# Patient Record
Sex: Male | Born: 1965 | Race: White | Hispanic: No | Marital: Single | Smoking: Former smoker
Health system: Southern US, Community
[De-identification: ages and names within clinical notes are randomized; demographics above are authoritative.]

## PROBLEM LIST (undated history)

## (undated) DIAGNOSIS — Z930 Tracheostomy status: Secondary | ICD-10-CM

## (undated) DIAGNOSIS — J449 Chronic obstructive pulmonary disease, unspecified: Secondary | ICD-10-CM

## (undated) HISTORY — PX: BRONCHOSCOPY: SUR163

## (undated) HISTORY — PX: CARDIOTHORACIC PROCEDURE: SHX1298

---

## 2005-01-23 ENCOUNTER — Emergency Department: Payer: Self-pay | Admitting: Unknown Physician Specialty

## 2006-12-22 ENCOUNTER — Emergency Department: Payer: Self-pay | Admitting: Emergency Medicine

## 2007-08-07 ENCOUNTER — Emergency Department: Payer: Self-pay | Admitting: Emergency Medicine

## 2008-01-14 ENCOUNTER — Emergency Department: Payer: Self-pay | Admitting: Emergency Medicine

## 2008-06-27 ENCOUNTER — Emergency Department: Payer: Self-pay | Admitting: Emergency Medicine

## 2009-06-11 ENCOUNTER — Emergency Department: Payer: Self-pay | Admitting: Emergency Medicine

## 2009-10-06 ENCOUNTER — Emergency Department: Payer: Self-pay | Admitting: Emergency Medicine

## 2014-03-31 ENCOUNTER — Emergency Department: Payer: Self-pay | Admitting: Emergency Medicine

## 2014-03-31 LAB — URINALYSIS, COMPLETE
BILIRUBIN, UR: NEGATIVE
Bacteria: NONE SEEN
GLUCOSE, UR: NEGATIVE mg/dL (ref 0–75)
Ketone: NEGATIVE
NITRITE: NEGATIVE
PH: 5 (ref 4.5–8.0)
Protein: NEGATIVE
Specific Gravity: 1.02 (ref 1.003–1.030)
Squamous Epithelial: NONE SEEN
WBC UR: 507 /HPF (ref 0–5)

## 2014-04-04 LAB — URINE CULTURE

## 2014-04-09 ENCOUNTER — Emergency Department: Payer: Self-pay | Admitting: Emergency Medicine

## 2015-04-21 ENCOUNTER — Emergency Department: Payer: Self-pay

## 2015-04-21 ENCOUNTER — Emergency Department
Admission: EM | Admit: 2015-04-21 | Discharge: 2015-04-21 | Disposition: A | Discharge status: Home / Self Care | Payer: Self-pay | Attending: Emergency Medicine | Admitting: Emergency Medicine

## 2015-04-21 ENCOUNTER — Encounter: Payer: Self-pay | Admitting: Urgent Care

## 2015-04-21 DIAGNOSIS — F17200 Nicotine dependence, unspecified, uncomplicated: Secondary | ICD-10-CM | POA: Insufficient documentation

## 2015-04-21 DIAGNOSIS — J159 Unspecified bacterial pneumonia: Secondary | ICD-10-CM | POA: Insufficient documentation

## 2015-04-21 DIAGNOSIS — J189 Pneumonia, unspecified organism: Secondary | ICD-10-CM

## 2015-04-21 MED ORDER — IPRATROPIUM-ALBUTEROL 0.5-2.5 (3) MG/3ML IN SOLN
3.0000 mL | Freq: Once | RESPIRATORY_TRACT | Status: AC
  Administered 2015-04-21: 3 mL via RESPIRATORY_TRACT
  Filled 2015-04-21: qty 3

## 2015-04-21 MED ORDER — BENZONATATE 100 MG PO CAPS: 100.0000 mg | ORAL_CAPSULE | Freq: Three times a day (TID) | ORAL | Status: DC | PRN

## 2015-04-21 MED ORDER — AZITHROMYCIN 250 MG PO TABS: ORAL_TABLET | ORAL | Status: DC

## 2015-04-21 MED ORDER — ACETAMINOPHEN-CODEINE #3 300-30 MG PO TABS: 1.0000 | ORAL_TABLET | Freq: Two times a day (BID) | ORAL | Status: DC

## 2015-04-21 MED ORDER — AZITHROMYCIN 250 MG PO TABS
500.0000 mg | ORAL_TABLET | Freq: Once | ORAL | Status: AC
  Administered 2015-04-21: 500 mg via ORAL
  Filled 2015-04-21: qty 2

## 2015-04-21 NOTE — Discharge Instructions (Signed)
Community-Acquired Pneumonia, Adult Pneumonia is an infection of the lungs. One type of pneumonia can happen while a person is in a hospital. A different type can happen when a person is not in a hospital (community-acquired pneumonia). It is easy for this kind to spread from person to person. It can spread to you if you breathe near an infected person who coughs or sneezes. Some symptoms include:  A dry cough.  A wet (productive) cough.  Fever.  Sweating.  Chest pain. HOME CARE  Take over-the-counter and prescription medicines only as told by your doctor.  Only take cough medicine if you are losing sleep.  If you were prescribed an antibiotic medicine, take it as told by your doctor. Do not stop taking the antibiotic even if you start to feel better.  Sleep with your head and neck raised (elevated). You can do this by putting a few pillows under your head, or you can sleep in a recliner.  Do not use tobacco products. These include cigarettes, chewing tobacco, and e-cigarettes. If you need help quitting, ask your doctor.  Drink enough water to keep your pee (urine) clear or pale yellow. A shot (vaccine) can help prevent pneumonia. Shots are often suggested for:  People older than 47 years of age.  People older than 47 years of age:  Who are having cancer treatment.  Who have long-term (chronic) lung disease.  Who have problems with their body's defense system (immune system). You may also prevent pneumonia if you take these actions:  Get the flu (influenza) shot every year.  Go to the dentist as often as told.  Wash your hands often. If soap and water are not available, use hand sanitizer. GET HELP IF:  You have a fever.  You lose sleep because your cough medicine does not help. GET HELP RIGHT AWAY IF:  You are short of breath and it gets worse.  You have more chest pain.  Your sickness gets worse. This is very serious if:  You are an older adult.  Your  body's defense system is weak.  You cough up blood.   This information is not intended to replace advice given to you by your health care provider. Make sure you discuss any questions you have with your health care provider.   Document Released: 10/27/2007 Document Revised: 01/29/2015 Document Reviewed: 09/04/2014 Elsevier Interactive Patient Education [employer]2016 [employer] Inc.  Take the prescription antibiotic as directed. Take the cough liqui-gels as needed for cough. Take the Tylenol with codeine as needed for chest wall pain and sleep.  Repeat your chest x-ray in 3-4 weeks to confirm treatment and clear lung findings.

## 2015-04-21 NOTE — ED Provider Notes (Signed)
[facility] Emergency Department Provider Note ____________________________________________  Time seen: 2155  I have reviewed the triage vital signs and the nursing notes.  HISTORY  Chief Complaint  Wheezing and Cough  HPI Donald Abbott is a 49 y.o. male reports to the ED for evaluation of coughand wheezing for the last 2 and half weeks. He reports that his cough has been productive but he denies any chest pain, shortness of breath, or vomiting. He denies any interim fevers. He has dosed ibuprofen intermittently for his cough control. He is a smoker, and admits to continuing to smoke despite his symptoms of the last few weeks. So admits to some intermittent right-sided lower chest pain over the last few weeks.  History reviewed. No pertinent past medical history.  There are no active problems to display for this patient.  History reviewed. No pertinent past surgical history.  Current Outpatient Rx  Name  Route  Sig  Dispense  Refill  . acetaminophen-codeine (TYLENOL #3) 300-30 MG tablet   Oral   Take 1 tablet by mouth 2 (two) times daily.   10 tablet   0   . azithromycin (ZITHROMAX Z-PAK) 250 MG tablet      Take 1 tablet (250 mg) once daily on for the next 4 days.   4 each   0   . benzonatate (TESSALON PERLES) 100 MG capsule   Oral   Take 1 capsule (100 mg total) by mouth 3 (three) times daily as needed for cough (Take 1-2 per dose).   30 capsule   0     Allergies Review of patient's allergies indicates no known allergies.  No family history on file.  Social History Social History  Substance Use Topics  . Smoking status: Current Every Day Smoker  . Smokeless tobacco: None  . Alcohol Use: Yes   Review of Systems  Constitutional: Negative for fever. Eyes: Negative for visual changes. ENT: Negative for sore throat. Cardiovascular: Negative for chest pain. Respiratory: Negative for shortness of breath. Cough and wheezing as  above. Gastrointestinal: Negative for abdominal pain, vomiting and diarrhea. Genitourinary: Negative for dysuria. Musculoskeletal: Negative for back pain. Skin: Negative for rash. Neurological: Negative for headaches, focal weakness or numbness. ____________________________________________  PHYSICAL EXAM:  VITAL SIGNS: ED Triage Vitals  Enc Vitals Group     BP 04/21/15 2103 114/66 mmHg     Pulse Rate 04/21/15 2103 98     Resp 04/21/15 2103 18     Temp 04/21/15 2103 98.2 F (36.8 C)     Temp Source 04/21/15 2103 Oral     SpO2 04/21/15 2103 95 %     Weight 04/21/15 2103 137 lb (62.143 kg)     Height 04/21/15 2103 5\' 8"  (1.727 m)     Head Cir --      Peak Flow --      Pain Score 04/21/15 2104 0     Pain Loc --      Pain Edu? --      Excl. in GC? --    Constitutional: Alert and oriented. Well appearing and in no distress. Head: Normocephalic and atraumatic.      Eyes: Conjunctivae are normal. PERRL. Normal extraocular movements      Ears: Canals clear. TMs intact bilaterally.   Nose: No congestion/rhinorrhea.   Mouth/Throat: Mucous membranes are moist.   Neck: Supple. No thyromegaly. Hematological/Lymphatic/Immunological: No cervical lymphadenopathy. Cardiovascular: Normal rate, regular rhythm.  Respiratory: Normal respiratory effort. No wheezes/rales/rhonchi. Gastrointestinal: Soft and  nontender. No distention. Musculoskeletal: Nontender with normal range of motion in all extremities.  Neurologic:  Normal gait without ataxia. Normal speech and language. No gross focal neurologic deficits are appreciated. Skin:  Skin is warm, dry and intact. No rash noted. Psychiatric: Mood and affect are normal. Patient exhibits appropriate insight and judgment. ____________________________________________   RADIOLOGY CXR IMPRESSION: Small focus of infiltrate lateral right base. Lungs elsewhere clear. Followup PA and lateral chest radiographs recommended in 3-4 weeks following  trial of antibiotic therapy to ensure resolution and exclude underlying malignancy.  I, Calel Pisarski, Charlesetta Ivory, personally viewed and evaluated these images (plain radiographs) as part of my medical decision making.  ____________________________________________  PROCEDURES  Azithromycin 500 mg PO DuoNeb x 1 ____________________________________________  INITIAL IMPRESSION / ASSESSMENT AND PLAN / ED COURSE  Patient with a community acquired pneumonia of the right lower lobe. He will be treated with prescriptions for azithromycin, a salon pearls, and Tylenol with codeine for chest wall pain. He is encouraged to cut back on his smoking and increase his fluid intake. He is also given instructions to return to the ED or his medical provider in 3-4 weeks for repeat x-ray as indicated by the radiology report. ____________________________________________  FINAL CLINICAL IMPRESSION(S) / ED DIAGNOSES  Final diagnoses:  Community acquired pneumonia      Lissa Hoard, PA-C 04/21/15 2326  Rockne Menghini, MD 04/21/15 2346

## 2015-04-21 NOTE — ED Notes (Signed)
Patient presents with a 2.5 week history of cough and wheezing; cough is productive. Denies fever.

## 2015-08-09 ENCOUNTER — Emergency Department: Payer: Self-pay

## 2015-08-09 ENCOUNTER — Emergency Department
Admission: EM | Admit: 2015-08-09 | Discharge: 2015-08-09 | Disposition: A | Discharge status: Home / Self Care | Payer: Self-pay | Attending: Emergency Medicine | Admitting: Emergency Medicine

## 2015-08-09 ENCOUNTER — Encounter: Payer: Self-pay | Admitting: Emergency Medicine

## 2015-08-09 DIAGNOSIS — J449 Chronic obstructive pulmonary disease, unspecified: Secondary | ICD-10-CM | POA: Insufficient documentation

## 2015-08-09 DIAGNOSIS — Z792 Long term (current) use of antibiotics: Secondary | ICD-10-CM | POA: Insufficient documentation

## 2015-08-09 DIAGNOSIS — Z79891 Long term (current) use of opiate analgesic: Secondary | ICD-10-CM | POA: Insufficient documentation

## 2015-08-09 DIAGNOSIS — F17200 Nicotine dependence, unspecified, uncomplicated: Secondary | ICD-10-CM | POA: Insufficient documentation

## 2015-08-09 MED ORDER — METHYLPREDNISOLONE 4 MG PO TBPK: ORAL_TABLET | ORAL | Status: DC

## 2015-08-09 MED ORDER — IPRATROPIUM-ALBUTEROL 0.5-2.5 (3) MG/3ML IN SOLN
3.0000 mL | Freq: Once | RESPIRATORY_TRACT | Status: AC
  Administered 2015-08-09: 3 mL via RESPIRATORY_TRACT
  Filled 2015-08-09: qty 3

## 2015-08-09 MED ORDER — PROMETHAZINE-DM 6.25-15 MG/5ML PO SYRP: 5.0000 mL | ORAL_SOLUTION | Freq: Four times a day (QID) | ORAL | Status: DC | PRN

## 2015-08-09 NOTE — ED Notes (Signed)
Cough x 1 week, feels intermittently wheezy.

## 2015-08-09 NOTE — ED Notes (Signed)
Pt verbalized understanding of discharge instructions. NAD at this time. 

## 2015-08-09 NOTE — ED Provider Notes (Signed)
[facility] Emergency Department Provider Note  ____________________________________________  Time seen: Approximately 5:21 PM  I have reviewed the triage vital signs and the nursing notes.   HISTORY  Chief Complaint Cough    HPI Cristela Blueimothy G Wike is a 50 y.o. male patient complaining of 1 week of cough and intermittent wheezing. Patient has been using his mother albuterol nebulizer treatment with some transient relief. Patient state his over-the-counter inhaler which is not helping his complaint. Patient's stay his complaint worse and plan down. Patient denies any fever associated this complaint. Patient seen 3 months ago with same complaint resolve after using antibiotics. Patient rates his pain discomfort as 8/10.   History reviewed. No pertinent past medical history.  There are no active problems to display for this patient.   History reviewed. No pertinent past surgical history.  Current Outpatient Rx  Name  Route  Sig  Dispense  Refill  . acetaminophen-codeine (TYLENOL #3) 300-30 MG tablet   Oral   Take 1 tablet by mouth 2 (two) times daily.   10 tablet   0   . azithromycin (ZITHROMAX Z-PAK) 250 MG tablet      Take 1 tablet (250 mg) once daily on for the next 4 days.   4 each   0   . benzonatate (TESSALON PERLES) 100 MG capsule   Oral   Take 1 capsule (100 mg total) by mouth 3 (three) times daily as needed for cough (Take 1-2 per dose).   30 capsule   0   . methylPREDNISolone (MEDROL DOSEPAK) 4 MG TBPK tablet      Take Tapered dose as directed   21 tablet   0   . promethazine-dextromethorphan (PROMETHAZINE-DM) 6.25-15 MG/5ML syrup   Oral   Take 5 mLs by mouth 4 (four) times daily as needed for cough.   118 mL   0     Allergies Review of patient's allergies indicates no known allergies.  No family history on file.  Social History Social History  Substance Use Topics  . Smoking status: Current Every Day Smoker  .  Smokeless tobacco: None  . Alcohol Use: Yes    Review of Systems Constitutional: No fever/chills Eyes: No visual changes. ENT: No sore throat. Cardiovascular: Denies chest pain. Respiratory: Patient states shortness of breath or wheezing. Gastrointestinal: No abdominal pain.  No nausea, no vomiting.  No diarrhea.  No constipation. Genitourinary: Negative for dysuria. Musculoskeletal: Negative for back pain. Skin: Negative for rash. Neurological: Negative for headaches, focal weakness or numbness.    ____________________________________________   PHYSICAL EXAM:  VITAL SIGNS: ED Triage Vitals  Enc Vitals Group     BP 08/09/15 1634 115/90 mmHg     Pulse Rate 08/09/15 1634 100     Resp 08/09/15 1634 20     Temp 08/09/15 1634 98.4 F (36.9 C)     Temp Source 08/09/15 1634 Oral     SpO2 08/09/15 1634 96 %     Weight 08/09/15 1634 144 lb (65.318 kg)     Height 08/09/15 1634 5\' 9"  (1.753 m)     Head Cir --      Peak Flow --      Pain Score 08/09/15 1635 8     Pain Loc --      Pain Edu? --      Excl. in GC? --     Constitutional: Alert and oriented. Well appearing and in no acute distress. Eyes: Conjunctivae are normal. PERRL. EOMI. Head:  Atraumatic. Nose: No congestion/rhinnorhea. Mouth/Throat: Mucous membranes are moist.  Oropharynx non-erythematous. Neck: No stridor.  No cervical spine tenderness to palpation. Hematological/Lymphatic/Immunilogical: No cervical lymphadenopathy. Cardiovascular: Normal rate, regular rhythm. Grossly normal heart sounds.  Good peripheral circulation. Respiratory: Normal respiratory effort.  No retractions. Lungs Rhonchi breath sounds and wheezing. Gastrointestinal: Soft and nontender. No distention. No abdominal bruits. No CVA tenderness. Musculoskeletal: No lower extremity tenderness nor edema.  No joint effusions. Neurologic:  Normal speech and language. No gross focal neurologic deficits are appreciated. No gait instability. Skin:  Skin  is warm, dry and intact. No rash noted. Psychiatric: Mood and affect are normal. Speech and behavior are normal.  ____________________________________________   LABS (all labs ordered are listed, but only abnormal results are displayed)  Labs Reviewed - No data to display ____________________________________________  EKG   ____________________________________________  RADIOLOGY  X-ray findings consistent with COPD. I, Joni Reining, personally viewed and evaluated these images (plain radiographs) as part of my medical decision making, as well as reviewing the written report by the radiologist.  ____________________________________________   PROCEDURES  Procedure(s) performed: None  Critical Care performed: No  ____________________________________________   INITIAL IMPRESSION / ASSESSMENT AND PLAN / ED COURSE  Pertinent labs & imaging results that were available during my care of the patient were reviewed by me and considered in my medical decision making (see chart for details).  COPD. Discussed x-ray finding with patient. Patient given a DuoNeb in the ER. Patient given discharge care instructions. Patient given a prescription for Bromfed-DM and Medrol Dosepak. Patient advised to discontinue cigarette smoking. Patient [facility]. ____________________________________________   FINAL CLINICAL IMPRESSION(S) / ED DIAGNOSES  Final diagnoses:  COPD suggested by initial evaluation [facility])      Joni Reining, PA-C 08/09/15 1746  Sharyn Creamer, MD 08/09/15 2231

## 2016-09-10 ENCOUNTER — Emergency Department
Admission: EM | Admit: 2016-09-10 | Discharge: 2016-09-10 | Disposition: A | Discharge status: Home / Self Care | Payer: Self-pay | Attending: Emergency Medicine | Admitting: Emergency Medicine

## 2016-09-10 ENCOUNTER — Encounter: Payer: Self-pay | Admitting: Emergency Medicine

## 2016-09-10 DIAGNOSIS — J441 Chronic obstructive pulmonary disease with (acute) exacerbation: Secondary | ICD-10-CM | POA: Insufficient documentation

## 2016-09-10 DIAGNOSIS — Z87891 Personal history of nicotine dependence: Secondary | ICD-10-CM | POA: Insufficient documentation

## 2016-09-10 DIAGNOSIS — Z79899 Other long term (current) drug therapy: Secondary | ICD-10-CM | POA: Insufficient documentation

## 2016-09-10 HISTORY — DX: Chronic obstructive pulmonary disease, unspecified: J44.9

## 2016-09-10 MED ORDER — IPRATROPIUM-ALBUTEROL 0.5-2.5 (3) MG/3ML IN SOLN
RESPIRATORY_TRACT | Status: AC
Start: 2016-09-10 — End: 2016-09-10
  Administered 2016-09-10: 3 mL
  Filled 2016-09-10: qty 9

## 2016-09-10 MED ORDER — PREDNISONE 20 MG PO TABS: 40.0000 mg | ORAL_TABLET | ORAL | Status: DC

## 2016-09-10 MED ORDER — PREDNISONE 20 MG PO TABS: 60.0000 mg | ORAL_TABLET | Freq: Every day | ORAL | 0 refills | Status: DC

## 2016-09-10 MED ORDER — PREDNISONE 20 MG PO TABS
ORAL_TABLET | ORAL | Status: AC
  Administered 2016-09-10: 60 mg
  Filled 2016-09-10: qty 3

## 2016-09-10 MED ORDER — ALBUTEROL SULFATE HFA 108 (90 BASE) MCG/ACT IN AERS: INHALATION_SPRAY | RESPIRATORY_TRACT | 1 refills | Status: DC

## 2016-09-10 NOTE — ED Notes (Signed)
Pt states he has been wheezing all week and sx got worse today and tonight.  cig smoker.  Pt also has a dry cough.  No fever.  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  Pt refusing lab work at this time.  Breathing treatment given stat. Pt alert.  Speech clear. Skin warm and dry

## 2016-09-10 NOTE — ED Notes (Signed)
Report off to jennifer rn 

## 2016-09-10 NOTE — ED Provider Notes (Signed)
[facility] Emergency Department Provider Note  ____________________________________________   First MD Initiated Contact with Patient 09/10/16 0310     (approximate)  I have reviewed the triage vital signs and the nursing notes.   HISTORY  Chief Complaint Shortness of Breath    HPI Donald Abbott is a 51 y.o. male with a history of COPD who presents for evaluation of shortness of breath.  He states his symptoms of a gradually worsening all week in spite of the use of albuterol inhaler at home.  He does report some seasonal allergies which been worse this week as well and he has been taking a daily allergy pill but it does not seem to be helping.  None of his interventions have helped him feel better.  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  When he arrived in the emergency department he had tachypnea and an SPO2 of about 88%.  I was with another patient but he was given duo nebs and prednisone based on my orders.  He was in no acute distress when I saw him and resting comfortably  He states this is happened to him in the past.  He denies chest pain, abdominal pain, nausea, vomiting, fever/chills.  He states that nebulizer treatments and prednisone typically help improve his symptoms.  He requested that we did not check any lab work, he just wants treatment so that he can get back to work in a few hours.   Past Medical History:  Diagnosis Date  . COPD (chronic obstructive pulmonary disease) (HCC)     There are no active problems to display for this patient.   History reviewed. No pertinent surgical history.  Prior to Admission medications   Medication Sig Start Date End Date Taking? Authorizing Provider  acetaminophen-codeine (TYLENOL #3) 300-30 MG tablet Take 1 tablet by mouth 2 (two) times daily. 04/21/15   Jenise V Bacon Menshew, PA-C  albuterol (PROVENTIL HFA;VENTOLIN HFA) 108 (90  Base) MCG/ACT inhaler Inhale 2-4 puffs by mouth every 4 hours as needed for wheezing, cough, and/or shortness of breath 09/10/16   Loleta Rose, MD  azithromycin (ZITHROMAX Z-PAK) 250 MG tablet Take 1 tablet (250 mg) once daily on for the next 4 days. 04/21/15   Jenise V Bacon Menshew, PA-C  benzonatate (TESSALON PERLES) 100 MG capsule Take 1 capsule (100 mg total) by mouth 3 (three) times daily as needed for cough (Take 1-2 per dose). 04/21/15   Jenise V Bacon Menshew, PA-C  methylPREDNISolone (MEDROL DOSEPAK) 4 MG TBPK tablet Take Tapered dose as directed 08/09/15   Joni Reining, PA-C  predniSONE (DELTASONE) 20 MG tablet Take 3 tablets (60 mg total) by mouth daily. 09/10/16   Loleta Rose, MD  promethazine-dextromethorphan (PROMETHAZINE-DM) 6.25-15 MG/5ML syrup Take 5 mLs by mouth 4 (four) times daily as needed for cough. 08/09/15   Joni Reining, PA-C    Allergies Patient has no known allergies.  History reviewed. No pertinent family history.  Social History Social History  Substance Use Topics  . Smoking status: Former Games developer  . Smokeless tobacco: Never Used  . Alcohol use Yes    Review of Systems Constitutional: No fever/chills Eyes: No visual changes. ENT: No sore throat. Cardiovascular: Denies chest pain. Respiratory: Gradually worsening shortness of breath over the last week with wheezing and increased work of breathing Gastrointestinal: No abdominal pain.  No nausea, no vomiting.  No diarrhea.  No  constipation. Genitourinary: Negative for dysuria. Musculoskeletal: Negative for back pain. Skin: Negative for rash. Neurological: Negative for headaches, focal weakness or numbness.  10-point ROS otherwise negative.  ____________________________________________   PHYSICAL EXAM:  VITAL SIGNS: ED Triage Vitals  Enc Vitals Group     BP 09/10/16 0220 (!) 143/77     Pulse Rate 09/10/16 0220 (!) 104     Resp 09/10/16 0220 (!) 30     Temp 09/10/16 0220 97.7 F (36.5 C)      Temp Source 09/10/16 0220 Oral     SpO2 09/10/16 0220 (!) 88 %     Weight 09/10/16 0221 143 lb (64.9 kg)     Height 09/10/16 0221  (1.753 m)     Head Circumference --      Peak Flow --      Pain Score 09/10/16 0220 0     Pain Loc --      Pain Edu? --      Excl. in GC? --     Constitutional: Alert and oriented. Well appearing and in no acute distress. Eyes: Conjunctivae are normal. PERRL. EOMI. Head: Atraumatic. Nose: No congestion/rhinnorhea. Mouth/Throat: Mucous membranes are moist. Neck: No stridor.  No meningeal signs.   Cardiovascular: Normal rate, regular rhythm. Good peripheral circulation. Grossly normal heart sounds. Respiratory: Normal respiratory effort.  No retractions. Minimal expiratory wheezing throughout lung fields.  No coarse breath sounds. Gastrointestinal: Soft and nontender. No distention.  Musculoskeletal: No lower extremity tenderness nor edema. No gross deformities of extremities. Neurologic:  Normal speech and language. No gross focal neurologic deficits are appreciated.  Skin:  Skin is warm, dry and intact. No rash noted. Psychiatric: Mood and affect are normal. Speech and behavior are normal.  ____________________________________________   LABS (all labs ordered are listed, but only abnormal results are displayed)  Labs Reviewed - No data to display ____________________________________________  EKG  ED ECG REPORT I, Maren Wiesen, the attending physician, personally viewed and interpreted this ECG.  Date: 09/10/2016 EKG Time: 02:18 Rate: 83 Rhythm: normal sinus rhythm QRS Axis: RAD Intervals: normal ST/T Wave abnormalities: Non-specific ST segment / T-wave changes, but no evidence of acute ischemia. Conduction Disturbances: none Narrative Interpretation: unremarkable  ____________________________________________  RADIOLOGY   No results found.  ____________________________________________   PROCEDURES  Critical Care performed:  No   Procedure(s) performed:   Procedures   ____________________________________________   INITIAL IMPRESSION / ASSESSMENT AND PLAN / ED COURSE  Pertinent labs & imaging results that were available during my care of the patient were reviewed by me and considered in my medical decision making (see chart for details).  Reportedly the patient had severe expiratory wheezing and increased work of breathing initially, but after duo nebs he seems back to baseline.  He has only normal residual auditory wheezing.  He states that he feels "100% better" and wants to go home.  I think that is appropriate under the circumstances.  I have provided him with a prescription for a burst course of prednisone and another prescription for albuterol inhaler should he run out.  I gave him the phone number for the patient navigator so that he can establish primary care doctor.  I gave my usual and customary return precautions.      Clinical Course as of Sep 11 419  Fri Sep 10, 2016  0331 Although the order in the computer is for prednisone 20 mg PO, Amy (RN) provided my verbal order dose which was 60 mg (confirmed by the  patient).  [CF]    Clinical Course User Index [CF] Loleta Rose, MD    ____________________________________________  FINAL CLINICAL IMPRESSION(S) / ED DIAGNOSES  Final diagnoses:  COPD exacerbation (HCC)     MEDICATIONS GIVEN DURING THIS VISIT:  Medications  ipratropium-albuterol (DUONEB) 0.5-2.5 (3) MG/3ML nebulizer solution (3 mLs  Given 09/10/16 0237)  predniSONE (DELTASONE) 20 MG tablet (60 mg  Given 09/10/16 0243)     NEW OUTPATIENT MEDICATIONS STARTED DURING THIS VISIT:  Discharge Medication List as of 09/10/2016  3:32 AM    START taking these medications   Details  albuterol (PROVENTIL HFA;VENTOLIN HFA) 108 (90 Base) MCG/ACT inhaler Inhale 2-4 puffs by mouth every 4 hours as needed for wheezing, cough, and/or shortness of breath, Print    predniSONE (DELTASONE) 20  MG tablet Take 3 tablets (60 mg total) by mouth daily., Starting Fri 09/10/2016, Print        Discharge Medication List as of 09/10/2016  3:32 AM      Discharge Medication List as of 09/10/2016  3:32 AM       Note:  This document was prepared using Dragon voice recognition software and may include unintentional dictation errors.    Loleta Rose, MD 09/10/16 [phone]

## 2016-09-10 NOTE — Discharge Instructions (Signed)
We believe that your symptoms are caused today by an exacerbation of your COPD, and possibly bronchitis.  Please take the prescribed medications and any medications that you have at home for your COPD.  Follow up with your doctor as recommended.  If you develop any new or worsening symptoms, including but not limited to fever, persistent vomiting, worsening shortness of breath, or other symptoms that concern you, please return to the Emergency Department immediately. ° °

## 2016-09-10 NOTE — ED Triage Notes (Signed)
Pt ambulatory to triage w/ labored breathing, report SOB x 1 week but worse tonight, 88% on RA

## 2016-09-10 NOTE — ED Notes (Signed)
2nd breathing treatment given to pt.

## 2016-11-15 ENCOUNTER — Emergency Department
Admission: EM | Admit: 2016-11-15 | Discharge: 2016-11-15 | Disposition: A | Discharge status: Home / Self Care | Payer: Self-pay | Attending: Emergency Medicine | Admitting: Emergency Medicine

## 2016-11-15 ENCOUNTER — Emergency Department: Payer: Self-pay

## 2016-11-15 DIAGNOSIS — Z87891 Personal history of nicotine dependence: Secondary | ICD-10-CM | POA: Insufficient documentation

## 2016-11-15 DIAGNOSIS — R0602 Shortness of breath: Secondary | ICD-10-CM | POA: Insufficient documentation

## 2016-11-15 DIAGNOSIS — J441 Chronic obstructive pulmonary disease with (acute) exacerbation: Secondary | ICD-10-CM | POA: Insufficient documentation

## 2016-11-15 LAB — COMPREHENSIVE METABOLIC PANEL
ALK PHOS: 44 U/L (ref 38–126)
ALT: 25 U/L (ref 17–63)
AST: 29 U/L (ref 15–41)
Albumin: 4.3 g/dL (ref 3.5–5.0)
Anion gap: 7 (ref 5–15)
BILIRUBIN TOTAL: 0.5 mg/dL (ref 0.3–1.2)
BUN: 18 mg/dL (ref 6–20)
CALCIUM: 9.4 mg/dL (ref 8.9–10.3)
CO2: 28 mmol/L (ref 22–32)
CREATININE: 0.93 mg/dL (ref 0.61–1.24)
Chloride: 104 mmol/L (ref 101–111)
GFR calc Af Amer: >60 mL/min (ref >60)
Glucose, Bld: 106 mg/dL — ABNORMAL HIGH (ref 65–99)
Potassium: 4 mmol/L (ref 3.5–5.1)
Sodium: 139 mmol/L (ref 135–145)
TOTAL PROTEIN: 7.1 g/dL (ref 6.5–8.1)

## 2016-11-15 LAB — CBC
HCT: 43.2 % (ref 40.0–52.0)
Hemoglobin: 14.8 g/dL (ref 13.0–18.0)
MCH: 30.5 pg (ref 26.0–34.0)
MCHC: 34.1 g/dL (ref 32.0–36.0)
MCV: 89.5 fL (ref 80.0–100.0)
PLATELETS: 196 10*3/uL (ref 150–440)
RBC: 4.83 MIL/uL (ref 4.40–5.90)
RDW: 13.6 % (ref 11.5–14.5)
WBC: 9 10*3/uL (ref 3.8–10.6)

## 2016-11-15 LAB — TROPONIN I: Troponin I: <0.03 ng/mL (ref <0.03)

## 2016-11-15 MED ORDER — PREDNISONE 20 MG PO TABS
40.0000 mg | ORAL_TABLET | Freq: Once | ORAL | Status: AC
Start: 2016-11-15 — End: 2016-11-15
  Administered 2016-11-15: 40 mg via ORAL
  Filled 2016-11-15: qty 2

## 2016-11-15 MED ORDER — IPRATROPIUM-ALBUTEROL 0.5-2.5 (3) MG/3ML IN SOLN
3.0000 mL | Freq: Once | RESPIRATORY_TRACT | Status: AC
  Administered 2016-11-15: 3 mL via RESPIRATORY_TRACT
  Filled 2016-11-15: qty 3

## 2016-11-15 MED ORDER — ALBUTEROL SULFATE HFA 108 (90 BASE) MCG/ACT IN AERS: 2.0000 | INHALATION_SPRAY | Freq: Four times a day (QID) | RESPIRATORY_TRACT | 2 refills | Status: DC | PRN

## 2016-11-15 MED ORDER — BENZONATATE 100 MG PO CAPS: 100.0000 mg | ORAL_CAPSULE | Freq: Four times a day (QID) | ORAL | 0 refills | Status: DC | PRN

## 2016-11-15 MED ORDER — AZITHROMYCIN 250 MG PO TABS: ORAL_TABLET | ORAL | 0 refills | Status: DC

## 2016-11-15 MED ORDER — AZITHROMYCIN 500 MG PO TABS
500.0000 mg | ORAL_TABLET | Freq: Once | ORAL | Status: AC
  Administered 2016-11-15: 500 mg via ORAL
  Filled 2016-11-15: qty 1

## 2016-11-15 MED ORDER — PREDNISONE 20 MG PO TABS: 40.0000 mg | ORAL_TABLET | Freq: Every day | ORAL | 0 refills | Status: DC

## 2016-11-15 MED ORDER — AEROCHAMBER PLUS MISC: 2 refills | Status: DC

## 2016-11-15 MED ORDER — ALBUTEROL SULFATE (2.5 MG/3ML) 0.083% IN NEBU
5.0000 mg | INHALATION_SOLUTION | Freq: Once | RESPIRATORY_TRACT | Status: AC
  Administered 2016-11-15: 5 mg via RESPIRATORY_TRACT
  Filled 2016-11-15: qty 6

## 2016-11-15 NOTE — ED Notes (Signed)
Reviewed d/c instructions, follow-up care, prescriptions with patient. Pt verbalized understanding.  

## 2016-11-15 NOTE — Discharge Instructions (Signed)
We believe that your symptoms are caused today by an exacerbation of your COPD, and possibly bronchitis.  Please take the prescribed medications and any medications that you have at home for your COPD.  Follow up with your doctor as recommended.  If you develop any new or worsening symptoms, including but not limited to fever, persistent vomiting, worsening shortness of breath, or other symptoms that concern you, please return to the Emergency Department immediately. ° °

## 2016-11-15 NOTE — ED Triage Notes (Signed)
Reports being short of breath for couple days, worse tonight.  Patient with history of COPD.

## 2016-11-15 NOTE — ED Provider Notes (Signed)
[facility] Emergency Department Provider Note   ____________________________________________   First MD Initiated Contact with Patient 11/15/16 0124     (approximate)  I have reviewed the triage vital signs and the nursing notes.   HISTORY  Chief Complaint Shortness of Breath    HPI Donald Abbott is a 51 y.o. male eports 2-3 days of increasing cough and ing. He is cared for by outpatient physin, and he has is home inhalers including albuterol and tiotropium. 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  Reports tonighthe is continue to expence wheezing, mild shortness of breath, and coughing. No pain. No recent illnesses. No surgeries. No history of blood clots. No leg swelling. No pain with deep inspiration.   Past Medical History:  Diagnosis Date  . COPD (chronic obstructive pulmonary disease) (HCC)     There are no active problems to display for this patient.   No past surgical history on file.  Prior to Admission medications   Medication Sig Start Date End Date Taking? Authorizing Provider  albuterol (PROVENTIL HFA;VENTOLIN HFA) 108 (90 Base) MCG/ACT inhaler Inhale 2 puffs into the lungs every 6 (six) hours as needed for wheezing or shortness of breath. 11/15/16   Sharyn CreamerQuale, Momin Misko, MD  azithromycin (ZITHROMAX) 250 MG tablet 1 tab by mouth daily for 4 days 11/15/16   Sharyn CreamerQuale, Lillybeth Tal, MD  benzonatate (TESSALON PERLES) 100 MG capsule Take 1 capsule (100 mg total) by mouth every 6 (six) hours as needed for cough. 11/15/16   Sharyn CreamerQuale, Ndidi Nesby, MD  predniSONE (DELTASONE) 20 MG tablet Take 2 tablets (40 mg total) by mouth daily. 11/15/16   Sharyn CreamerQuale, Thomasa Heidler, MD  Spacer/Aero-Holding Chambers (AEROCHAMBER PLUS) inhaler Use as instructed 11/15/16   Sharyn CreamerQuale, Rohith Fauth, MD    Allergies Patient has no known allergies.  No family history on file.  Social History Social History  Substance Use Topics  . Smoking  status: Former Games developermoker  . Smokeless tobacco: Never Used  . Alcohol use Yes    Review of Systems Constitutional: No fever/chills Eyes: No visual changes. ENT: No sore throat. Cardiovascular: Denies chest pain. Respiratory:see history of esent illness Gastrointestinal: No abdominal pain.  No nausea, no vomiting.  No diarrhea.  No constipation. Genitourinary: Negative for dysuria. Musculoskeletal: Negative for back pain. Skin: Negative for rash. Neurological: Negative for headaches, focal weakness or numbness.    ____________________________________________   PHYSICAL EXAM:  VITAL SIGNS: ED Triage Vitals  Enc Vitals Group     BP 11/15/16 0100 131/81     Pulse Rate 11/15/16 0100 88     Resp 11/15/16 0100 (!) 26     Temp --      Temp src --      SpO2 11/15/16 0100 94 %     Weight 11/15/16 0058 134 lb (60.8 kg)     Height 11/15/16 0058 5' 8.5" (1.74 m)     Head Circumference --      Peak Flow --      Pain Score --      Pain Loc --      Pain Edu? --      Excl. in GC? --     Constitutional: Alert and oriented. Well appearing and in no acute distress. Eyes: Conjunctivae are normal. Head: Atraumatic. Nose: No congestion/rhinnorhea. Mouth/Throat: Mucous membranes are moist. Neck: No stridor.   Cardiovascular: Normal rate, regular rhythm. Grossly normal heart sounds.  Good peripheral circulation.  Respiratory: Normal respiratory effort.  Moderate end expiratory wheezing throughout. Occasional coarse rhonchi with coughing. No use of accessory muscles. Speaks in full and clear sentences Gastrointestinal: Soft and nontender. No distention. Musculoskeletal: No lower extremity tenderness nor edema. Neurologic:  Normal speech and language. No gross focal neurologic deficits are appreciated.  Skin:  Skin is warm, dry and intact. No rash noted. Psychiatric: Mood and affect are normal. Speech and behavior are normal.  ____________________________________________   LABS (all labs  ordered are listed, but only abnormal results are displayed)  Labs Reviewed  COMPREHENSIVE METABOLIC PANEL - Abnormal; Notable for the following:       Result Value   Glucose, Bld 106 (*)    All other components within normal limits  TROPONIN I  CBC   ____________________________________________  EKG  Reviewed and interpreted by me at 1:10 AM Heart rate 90 Care is 80 QTc 470 Normal sinus rhythm, no evidence of ischemia or ectopy noted ____________________________________________  RADIOLOGY  Dg Chest 2 View  Result Date: 11/15/2016 CLINICAL DATA:  Acute onset dyspnea tonight. EXAM: CHEST  2 VIEW COMPARISON:  08/09/2015 FINDINGS: Unchanged hyperinflation. No consolidation or effusion. Mild chronic appearing interstitial coarsening. Normal heart size. Normal pulmonary vasculature. No pleural effusions. Unremarkable hilar and mediastinal contours. IMPRESSION: Hyperinflation and mild chronic interstitial coarsening. No consolidation or effusion. Normal vasculature. Electronically Signed   By: Ellery Plunk M.D.   On: 11/15/2016 01:58    ____________________________________________   PROCEDURES  Procedure(s) performed: None  Procedures  Critical Care performed: No  ____________________________________________   INITIAL IMPRESSION / ASSESSMENT AND PLAN / ED COURSE  Pertinent labs & imaging results that were available during my care of the patient were reviewed by me and considered in my medical decision making (see chart for details).  Cough, dyspnea and wheezing. Known histo. Clinical history and presentation appear consistent with COPD exacerbation with bronchitis.  Denies chest pain. Reassuring EKG.     Pulmonary Embolism Rule-out Criteria (PERC rule)                        If YES to ANY of the following, the PERC rule is not satisfied and cannot be used to rule out PE in this patient (consider d-dimer or imaging depending on pre-test probability).                       If NO to ALL of the following, AND the clinician's pre-test probability is <15%, the [facility] rule is satisfied and there is no need for further workup (including no need to obtain a d-dimer) as the post-test probability of pulmonary embolism is <2%.                      Mnemonic is HAD CLOTS   H - hormone use (exogenous estrogen)      No. A - age > 50                                                 No. D - DVT/PE history                                      No.   C - coughing  blood (hemoptysis)                 No. L - leg swelling, unilateral                             No. O - O2 Sat on Room Air < 95%                  No. T - tachycardia (HR ? 100)                         No. S - surgery or trauma, recent                      No.   Based on my evaluation of the patient, including application of this decision instrument, further testing to evaluate for pulmonary embolism is not indicated at this time.    ----------------------------------------- 225 AM on 11/15/2016 ----------------------------------------- Reexam. Patient wheezing improved. Speaking in full sentences with normal oxygen saturation. He appears in no distress and much improved. Appears appropriate for discharge. Return precautions and treatment recommendations and follow-up discussed with the patient who is agreeable with the plan.         ____________________________________________   FINAL CLINICAL IMPRESSION(S) / ED DIAGNOSES  Final diagnoses:  COPD exacerbation (HCC)      NEW MEDICATIONS STARTED DURING THIS VISIT:  New Prescriptions   ALBUTEROL (PROVENTIL HFA;VENTOLIN HFA) 108 (90 BASE) MCG/ACT INHALER    Inhale 2 puffs into the lungs every 6 (six) hours as needed for wheezing or shortness of breath.   AZITHROMYCIN (ZITHROMAX) 250 MG TABLET    1 tab by mouth daily for 4 days   BENZONATATE (TESSALON PERLES) 100 MG CAPSULE    Take 1 capsule (100 mg total) by mouth every 6 (six) hours as needed for  cough.   PREDNISONE (DELTASONE) 20 MG TABLET    Take 2 tablets (40 mg total) by mouth daily.   SPACER/AERO-HOLDING CHAMBERS (AEROCHAMBER PLUS) INHALER    Use as instructed     Note:  This document was prepared using Dragon voice recognition software and may include unintentional dictation errors.     Sharyn Creamer, MD 11/15/16 [phone]

## 2016-11-15 NOTE — ED Notes (Signed)
Patient reports relief of symptoms.

## 2018-07-09 ENCOUNTER — Emergency Department
Admission: EM | Admit: 2018-07-09 | Discharge: 2018-07-09 | Disposition: A | Discharge status: Home / Self Care | Payer: Medicaid Other | Attending: Student in an Organized Health Care Education/Training Program | Admitting: Student in an Organized Health Care Education/Training Program

## 2018-07-09 ENCOUNTER — Other Ambulatory Visit: Payer: Self-pay

## 2018-07-09 DIAGNOSIS — J029 Acute pharyngitis, unspecified: Secondary | ICD-10-CM | POA: Diagnosis present

## 2018-07-09 DIAGNOSIS — J111 Influenza due to unidentified influenza virus with other respiratory manifestations: Secondary | ICD-10-CM | POA: Diagnosis not present

## 2018-07-09 DIAGNOSIS — Z79899 Other long term (current) drug therapy: Secondary | ICD-10-CM | POA: Insufficient documentation

## 2018-07-09 DIAGNOSIS — Z87891 Personal history of nicotine dependence: Secondary | ICD-10-CM | POA: Insufficient documentation

## 2018-07-09 DIAGNOSIS — J449 Chronic obstructive pulmonary disease, unspecified: Secondary | ICD-10-CM | POA: Diagnosis not present

## 2018-07-09 DIAGNOSIS — R69 Illness, unspecified: Secondary | ICD-10-CM

## 2018-07-09 LAB — GROUP A STREP BY PCR: Group A Strep by PCR: NOT DETECTED

## 2018-07-09 MED ORDER — BENZONATATE 100 MG PO CAPS: 100.0000 mg | ORAL_CAPSULE | Freq: Three times a day (TID) | ORAL | 0 refills | Status: AC | PRN

## 2018-07-09 NOTE — ED Provider Notes (Signed)
[facility] Emergency Department Provider Note  ____________________________________________  Time seen: Approximately 9:15 PM  I have reviewed the triage vital signs and the nursing notes.   HISTORY  Chief Complaint Sore Throat    HPI Donald Abbott is a 53 y.o. male presents to the emergency department with pharyngitis, body aches, abdominal discomfort and chills for the past 2 days.  No chest pain, chest tightness, vomiting or diarrhea.  No recent travel or rash. No alleviating measures have been attempted.    Past Medical History:  Diagnosis Date  . COPD (chronic obstructive pulmonary disease) (HCC)     There are no active problems to display for this patient.   No past surgical history on file.  Prior to Admission medications   Medication Sig Start Date End Date Taking? Authorizing Provider  albuterol (PROVENTIL HFA;VENTOLIN HFA) 108 (90 Base) MCG/ACT inhaler Inhale 2 puffs into the lungs every 6 (six) hours as needed for wheezing or shortness of breath. 11/15/16   Sharyn Creamer, MD  azithromycin (ZITHROMAX) 250 MG tablet 1 tab by mouth daily for 4 days 11/15/16   Sharyn Creamer, MD  benzonatate (TESSALON PERLES) 100 MG capsule Take 1 capsule (100 mg total) by mouth 3 (three) times daily as needed for up to 7 days for cough. 07/09/18 07/16/18  Orvil Feil, PA-C  predniSONE (DELTASONE) 20 MG tablet Take 2 tablets (40 mg total) by mouth daily. 11/15/16   Sharyn Creamer, MD  Spacer/Aero-Holding Chambers (AEROCHAMBER PLUS) inhaler Use as instructed 11/15/16   Sharyn Creamer, MD    Allergies Patient has no known allergies.  No family history on file.  Social History Social History   Tobacco Use  . Smoking status: Former Games developer  . Smokeless tobacco: Never Used  Substance Use Topics  . Alcohol use: Yes  . Drug use: Not on file      Review of Systems  Constitutional: Patient has fever.  Eyes: No visual changes. No discharge ENT: Patient has  congestion.  Cardiovascular: no chest pain. Respiratory: Patient has cough.  Gastrointestinal: No abdominal pain.  No nausea, no vomiting. No diarrhea.  Genitourinary: Negative for dysuria. No hematuria Musculoskeletal: Patient has myalgias.  Skin: Negative for rash, abrasions, lacerations, ecchymosis. Neurological: Patient has headache, no focal weakness or numbness.     ____________________________________________   PHYSICAL EXAM:  VITAL SIGNS: ED Triage Vitals  Enc Vitals Group     BP 07/09/18 2008 (!) 157/87     Pulse Rate 07/09/18 2008 (!) 110     Resp 07/09/18 2008 18     Temp 07/09/18 2008 98.6 F (37 C)     Temp Source 07/09/18 2008 Oral     SpO2 07/09/18 2008 96 %     Weight 07/09/18 2006 145 lb (65.8 kg)     Height 07/09/18 2006 5\' 8"  (1.727 m)     Head Circumference --      Peak Flow --      Pain Score 07/09/18 2006 8     Pain Loc --      Pain Edu? --      Excl. in GC? --      Constitutional: Alert and oriented. Patient is lying supine. Eyes: Conjunctivae are normal. PERRL. EOMI. Head: Atraumatic. ENT:      Ears: Tympanic membranes are mildly injected with mild effusion bilaterally.       Nose: No congestion/rhinnorhea.      Mouth/Throat: Mucous membranes are moist. Posterior pharynx is mildly erythematous.  Hematological/Lymphatic/Immunilogical: No cervical lymphadenopathy.  Cardiovascular: Normal rate, regular rhythm. Normal S1 and S2.  Good peripheral circulation. Respiratory: Normal respiratory effort without tachypnea or retractions. Lungs CTAB. Good air entry to the bases with no decreased or absent breath sounds. Gastrointestinal: Bowel sounds 4 quadrants. Soft and nontender to palpation. No guarding or rigidity. No palpable masses. No distention. No CVA tenderness. Musculoskeletal: Full range of motion to all extremities. No gross deformities appreciated. Neurologic:  Normal speech and language. No gross focal neurologic deficits are appreciated.   Skin:  Skin is warm, dry and intact. No rash noted. Psychiatric: Mood and affect are normal. Speech and behavior are normal. Patient exhibits appropriate insight and judgement.    ____________________________________________   LABS (all labs ordered are listed, but only abnormal results are displayed)  Labs Reviewed  GROUP A STREP BY PCR   ____________________________________________  EKG   ____________________________________________  RADIOLOGY   No results found.  ____________________________________________    PROCEDURES  Procedure(s) performed:    Procedures    Medications - No data to display   ____________________________________________   INITIAL IMPRESSION / ASSESSMENT AND PLAN / ED COURSE  Pertinent labs & imaging results that were available during my care of the patient were reviewed by me and considered in my medical decision making (see chart for details).  Review of the [place] CSRS was performed in accordance of the [facility] prior to dispensing any controlled drugs.      Assessment and Plan: Influenza-like illness Patient presents to the emergency department with flulike symptoms.  Patient declined treatment with Tamiflu.  Rest and hydration were encouraged at home.  Tylenol and ibuprofen alternating for fever were recommended.  Patient was advised to follow-up with primary care as needed.  All patient questions were answered.     ____________________________________________  FINAL CLINICAL IMPRESSION(S) / ED DIAGNOSES  Final diagnoses:  Influenza-like illness      NEW MEDICATIONS STARTED DURING THIS VISIT:  ED Discharge Orders         Ordered    benzonatate (TESSALON PERLES) 100 MG capsule  3 times daily PRN     07/09/18 2113              This chart was dictated using voice recognition software/Dragon. Despite best efforts to proofread, errors can occur which can change the meaning. Any change was purely unintentional.     Gasper Lloyd 07/09/18 2118    Willy Eddy, MD 07/09/18 [phone]

## 2018-07-09 NOTE — ED Triage Notes (Signed)
Reports sore throat and "swollen glands" for couple days.

## 2018-07-12 ENCOUNTER — Emergency Department: Payer: Medicaid Other

## 2018-07-12 ENCOUNTER — Other Ambulatory Visit: Payer: Self-pay

## 2018-07-12 ENCOUNTER — Emergency Department
Admission: EM | Admit: 2018-07-12 | Discharge: 2018-07-12 | Disposition: A | Discharge status: Other Acute Inpatient Hospital | Payer: Medicaid Other | Attending: Emergency Medicine | Admitting: Emergency Medicine

## 2018-07-12 ENCOUNTER — Encounter: Payer: Self-pay | Admitting: Emergency Medicine

## 2018-07-12 DIAGNOSIS — J449 Chronic obstructive pulmonary disease, unspecified: Secondary | ICD-10-CM | POA: Insufficient documentation

## 2018-07-12 DIAGNOSIS — Z87891 Personal history of nicotine dependence: Secondary | ICD-10-CM | POA: Insufficient documentation

## 2018-07-12 DIAGNOSIS — J390 Retropharyngeal and parapharyngeal abscess: Secondary | ICD-10-CM | POA: Diagnosis not present

## 2018-07-12 DIAGNOSIS — A419 Sepsis, unspecified organism: Secondary | ICD-10-CM | POA: Diagnosis not present

## 2018-07-12 DIAGNOSIS — J9851 Mediastinitis: Secondary | ICD-10-CM | POA: Insufficient documentation

## 2018-07-12 DIAGNOSIS — J029 Acute pharyngitis, unspecified: Secondary | ICD-10-CM | POA: Diagnosis present

## 2018-07-12 LAB — CBC WITH DIFFERENTIAL/PLATELET
Abs Immature Granulocytes: 0.9 10*3/uL — ABNORMAL HIGH (ref 0.00–0.07)
BASOS PCT: 0 %
Band Neutrophils: 13 %
Basophils Absolute: 0 10*3/uL (ref 0.0–0.1)
Eosinophils Absolute: 0 10*3/uL (ref 0.0–0.5)
Eosinophils Relative: 0 %
HCT: 45.6 % (ref 39.0–52.0)
HEMOGLOBIN: 15.1 g/dL (ref 13.0–17.0)
LYMPHS PCT: 12 %
Lymphs Abs: 1.3 10*3/uL (ref 0.7–4.0)
MCH: 28.6 pg (ref 26.0–34.0)
MCHC: 33.1 g/dL (ref 30.0–36.0)
MCV: 86.4 fL (ref 80.0–100.0)
Metamyelocytes Relative: 6 %
Monocytes Absolute: 1.3 10*3/uL — ABNORMAL HIGH (ref 0.1–1.0)
Monocytes Relative: 12 %
Myelocytes: 2 %
Neutro Abs: 7.6 10*3/uL (ref 1.7–7.7)
Neutrophils Relative %: 55 %
Platelets: 196 10*3/uL (ref 150–400)
RBC: 5.28 MIL/uL (ref 4.22–5.81)
RDW: 13.2 % (ref 11.5–15.5)
SMEAR REVIEW: NORMAL
WBC: 11.2 10*3/uL — ABNORMAL HIGH (ref 4.0–10.5)
nRBC: 0 % (ref 0.0–0.2)

## 2018-07-12 LAB — COMPREHENSIVE METABOLIC PANEL
ALT: 16 U/L (ref 0–44)
AST: 28 U/L (ref 15–41)
Albumin: 3.8 g/dL (ref 3.5–5.0)
Alkaline Phosphatase: 61 U/L (ref 38–126)
Anion gap: 11 (ref 5–15)
BUN: 30 mg/dL — ABNORMAL HIGH (ref 6–20)
CO2: 21 mmol/L — ABNORMAL LOW (ref 22–32)
Calcium: 9.5 mg/dL (ref 8.9–10.3)
Chloride: 103 mmol/L (ref 98–111)
Creatinine, Ser: 1.33 mg/dL — ABNORMAL HIGH (ref 0.61–1.24)
GFR calc Af Amer: >60 mL/min (ref >60)
GFR calc non Af Amer: >60 mL/min (ref >60)
Glucose, Bld: 141 mg/dL — ABNORMAL HIGH (ref 70–99)
Potassium: 3.9 mmol/L (ref 3.5–5.1)
Sodium: 135 mmol/L (ref 135–145)
Total Bilirubin: 1.3 mg/dL — ABNORMAL HIGH (ref 0.3–1.2)
Total Protein: 7.7 g/dL (ref 6.5–8.1)

## 2018-07-12 LAB — TSH: TSH: 1.168 u[IU]/mL (ref 0.350–4.500)

## 2018-07-12 LAB — INFLUENZA PANEL BY PCR (TYPE A & B)
INFLBPCR: NEGATIVE
Influenza A By PCR: NEGATIVE

## 2018-07-12 LAB — LACTIC ACID, PLASMA
Lactic Acid, Venous: 2.8 mmol/L (ref 0.5–1.9)
Lactic Acid, Venous: 2.7 mmol/L (ref 0.5–1.9)

## 2018-07-12 LAB — GROUP A STREP BY PCR: Group A Strep by PCR: NOT DETECTED

## 2018-07-12 LAB — TROPONIN I: Troponin I: <0.03 ng/mL (ref <0.03)

## 2018-07-12 MED ORDER — IOHEXOL 300 MG/ML  SOLN
60.0000 mL | Freq: Once | INTRAMUSCULAR | Status: AC | PRN
  Administered 2018-07-12: 60 mL via INTRAVENOUS

## 2018-07-12 MED ORDER — PIPERACILLIN-TAZOBACTAM 3.375 G IVPB 30 MIN
3.3750 g | Freq: Once | INTRAVENOUS | Status: AC
  Administered 2018-07-12: 3.375 g via INTRAVENOUS
  Filled 2018-07-12: qty 50

## 2018-07-12 MED ORDER — SODIUM CHLORIDE 0.9 % IV SOLN
Freq: Once | INTRAVENOUS | Status: AC
  Administered 2018-07-12: 09:00:00 via INTRAVENOUS

## 2018-07-12 MED ORDER — MORPHINE SULFATE (PF) 4 MG/ML IV SOLN
4.0000 mg | Freq: Once | INTRAVENOUS | Status: AC
  Administered 2018-07-12: 4 mg via INTRAVENOUS

## 2018-07-12 MED ORDER — ONDANSETRON HCL 4 MG/2ML IJ SOLN
4.0000 mg | Freq: Once | INTRAMUSCULAR | Status: AC
  Administered 2018-07-12: 4 mg via INTRAVENOUS
  Filled 2018-07-12: qty 2

## 2018-07-12 MED ORDER — VANCOMYCIN HCL IN DEXTROSE 1-5 GM/200ML-% IV SOLN
1000.0000 mg | Freq: Once | INTRAVENOUS | Status: AC
  Administered 2018-07-12: 1000 mg via INTRAVENOUS
  Filled 2018-07-12: qty 200

## 2018-07-12 MED ORDER — IOHEXOL 300 MG/ML  SOLN
75.0000 mL | Freq: Once | INTRAMUSCULAR | Status: AC | PRN
  Administered 2018-07-12: 75 mL via INTRAVENOUS

## 2018-07-12 MED ORDER — MORPHINE SULFATE (PF) 4 MG/ML IV SOLN
INTRAVENOUS | Status: AC
  Filled 2018-07-12: qty 1

## 2018-07-12 MED ORDER — SODIUM CHLORIDE 0.9 % IV BOLUS
1000.0000 mL | Freq: Once | INTRAVENOUS | Status: AC
  Administered 2018-07-12: 1000 mL via INTRAVENOUS

## 2018-07-12 MED ORDER — MORPHINE SULFATE (PF) 4 MG/ML IV SOLN
4.0000 mg | Freq: Once | INTRAVENOUS | Status: AC
  Administered 2018-07-12: 4 mg via INTRAVENOUS
  Filled 2018-07-12: qty 1

## 2018-07-12 MED ORDER — PIPERACILLIN-TAZOBACTAM 3.375 G IVPB 30 MIN: 3.3750 g | Freq: Once | INTRAVENOUS | Status: DC

## 2018-07-12 MED ORDER — ONDANSETRON HCL 4 MG/2ML IJ SOLN
INTRAMUSCULAR | Status: AC
  Filled 2018-07-12: qty 2

## 2018-07-12 MED ORDER — ONDANSETRON HCL 4 MG/2ML IJ SOLN: 4.0000 mg | Freq: Once | INTRAMUSCULAR | Status: DC

## 2018-07-12 MED ORDER — CLINDAMYCIN PHOSPHATE 900 MG/50ML IV SOLN
900.0000 mg | Freq: Once | INTRAVENOUS | Status: AC
  Administered 2018-07-12: 900 mg via INTRAVENOUS

## 2018-07-12 MED ORDER — LORAZEPAM 2 MG/ML IJ SOLN
0.5000 mg | Freq: Once | INTRAMUSCULAR | Status: AC
  Administered 2018-07-12: 0.5 mg via INTRAVENOUS
  Filled 2018-07-12: qty 1

## 2018-07-12 MED ORDER — MORPHINE SULFATE (PF) 2 MG/ML IV SOLN
2.0000 mg | Freq: Once | INTRAVENOUS | Status: AC
  Administered 2018-07-12: 2 mg via INTRAVENOUS
  Filled 2018-07-12: qty 1

## 2018-07-12 MED ORDER — DEXAMETHASONE SODIUM PHOSPHATE 10 MG/ML IJ SOLN
20.0000 mg | Freq: Once | INTRAMUSCULAR | Status: AC
  Administered 2018-07-12: 20 mg via INTRAVENOUS
  Filled 2018-07-12: qty 2

## 2018-07-12 NOTE — Progress Notes (Signed)
CODE SEPSIS - PHARMACY COMMUNICATION  **Broad Spectrum Antibiotics should be administered within 1 hour of Sepsis diagnosis**  Time Code Sepsis Called/Page Received: 0730  Antibiotics Ordered: Clindamycin, Zosyn, Vancomycin  Time of 1st antibiotic administration:  0709  Additional action taken by pharmacy: n/a  If necessary, Name of Provider/Nurse Contacted: Ricke Hey ,PharmD Clinical Pharmacist  07/12/2018  7:31 AM

## 2018-07-12 NOTE — ED Notes (Signed)
MD Oaks at bedside

## 2018-07-12 NOTE — ED Notes (Signed)
Called [facility] for Emergency Transport to [facility] ED  [phone]

## 2018-07-12 NOTE — ED Notes (Signed)
Pt placed on 2L [place] for comfort per MD Mayford Knife

## 2018-07-12 NOTE — ED Notes (Signed)
Called [facility] for transfer spoke to [facility] 0921

## 2018-07-12 NOTE — ED Notes (Signed)
EMTALA reviewed. 

## 2018-07-12 NOTE — Progress Notes (Signed)
Patient ID: DELTA ISING, male   DOB: 09/21/1965, 53 y.o.   MRN: [id number]  Chief Complaint  Patient presents with  . Sore Throat  . Chest Pain    Referred By Dr. Daryel November Reason for Referral mediastinitis  HPI Location, Quality, Duration, Severity, Timing, Context, Modifying Factors, Associated Signs and Symptoms.  BRAND ALEMANY is a 53 y.o. male.  This patient is a 53 year old male previously healthy who came to our emergency department with complaints of sore throat and was diagnosed of having an upper respiratory tract infection several days ago.  He continued to have a sore throat and ultimately had difficulty swallowing and speaking and re-presented with complaints of severe sore throat.  He states that it is difficult to speak and to swallow because of the pain in his neck.  He underwent a CT scan of the neck and chest which revealed a retropharyngeal infection with several areas of small abscess formation extending from the neck down to the carina.  In addition the paraesophageal tissues below the carina were also edematous.  There is no frank pleural effusion or obvious gas within the mediastinum.  I was asked to see the patient for consideration of surgical intervention.  This is obviously an extensive case requiring a multidisciplinary approach and at the present time we did not have sufficient coverage to allow for management of this complicated problem here at our hospital.   Past Medical History:  Diagnosis Date  . COPD (chronic obstructive pulmonary disease) (HCC)     History reviewed. No pertinent surgical history.  No family history on file.  Social History Social History   Tobacco Use  . Smoking status: Former Games developer  . Smokeless tobacco: Never Used  Substance Use Topics  . Alcohol use: Yes  . Drug use: Not on file    Allergies  Allergen Reactions  . Other     Dog and cat dander intermittently    Current Facility-Administered Medications   Medication Dose Route Frequency Provider Last Rate Last Dose  . ondansetron (ZOFRAN) injection 4 mg  4 mg Intravenous Once Emily Filbert, MD   Stopped at 07/12/18 [phone]   Current Outpatient Medications  Medication Sig Dispense Refill  . albuterol (PROVENTIL HFA;VENTOLIN HFA) 108 (90 Base) MCG/ACT inhaler Inhale 2 puffs into the lungs every 6 (six) hours as needed for wheezing or shortness of breath. 1 Inhaler 2  . budesonide-formoterol (SYMBICORT) 160-4.5 MCG/ACT inhaler Inhale 2 puffs into the lungs 2 (two) times daily.    Marland Kitchen Spacer/Aero-Holding Chambers (AEROCHAMBER PLUS) inhaler Use as instructed 1 each 2  . benzonatate (TESSALON PERLES) 100 MG capsule Take 1 capsule (100 mg total) by mouth 3 (three) times daily as needed for up to 7 days for cough. 20 capsule 0      Review of Systems A complete review of systems was asked and was negative except for the following positive findings difficulty swallowing and difficulty speaking  Blood pressure 106/76, pulse (!) 110, temperature (!) 96.9 F (36.1 C), resp. rate 18, height 5\' 8"  (1.727 m), weight 65.8 kg, SpO2 98 %.  Physical Exam CONSTITUTIONAL:  Pleasant, well-developed, well-nourished, and in mild distress. EYES: Pupils equal and reactive to light, Sclera non-icteric EARS, NOSE, MOUTH AND THROAT: He did appear to have some swelling of his neck and it was tender to palpation throughout. LYMPH NODES:  Lymph nodes in the neck and axillae were normal RESPIRATORY:  Lungs were clear.  Normal respiratory effort without pathologic use  of accessory muscles of respiration CARDIOVASCULAR: Heart was regular without murmurs.  There were no carotid bruits. SKIN:  There were no pathologic skin lesions.  There were no nodules on palpation.  Data Reviewed CT scan  I have personally reviewed the patient's imaging, laboratory findings and medical records.    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    Plan    I reviewed the case with Dr. Daryel November.  I do think that he has a retropharyngeal abscess with mediastinitis.  Because of the complexity of the case I would recommend referral to a tertiary care center.  I discussed this with the patient and her family as well as with Dr. Mayford Knife and he will arrange for transportation to [facility] at the family's request        Hulda Marin, MD 07/12/2018, 10:03 AM   Patient ID: Cristela Blue, male   DOB: Sep 30, 1965, 53 y.o.   MRN: [id number]

## 2018-07-12 NOTE — ED Triage Notes (Addendum)
Patient ambulatory to triage with steady gait, without difficulty, appears uncomfortable, voice muffled, frequent clearing of throat, mask in place; seen Sunday and dx with influenza with no rx; cont to have fever, sore throat with swelling to neck, unable to swallow; pt also reports upper CP; denies any cough or congestion but fever persists; charge nurse called for exam room

## 2018-07-12 NOTE — ED Notes (Signed)
[facility]  CALLED  FOR  TRANSFER 

## 2018-07-12 NOTE — ED Notes (Signed)
Pt taken to room 12 via w/c; placed in hosp gown & on card monitor; Dr Manson Passey called to bedside and report given to care nurse Revonda Standard, RN

## 2018-07-12 NOTE — ED Provider Notes (Addendum)
[facility] Emergency Department Provider Note _________________________   First MD Initiated Contact with Patient 07/12/18 0701     (approximate)  I have reviewed the triage vital signs and the nursing notes.   HISTORY  Chief Complaint Sore Throat and Chest Pain    HPI Donald Abbott is a 53 y.o. male with history of COPD and recently diagnosed influenza returns to the emergency department secondary to progressive sore throat, difficulty breathing and swallowing with neck swelling since Monday.  He and his spouse admits to progressive swelling of the neck with worsening discomfort with speaking and swallowing.  Patient denies any fever afebrile on presentation   Past Medical History:  Diagnosis Date  . COPD (chronic obstructive pulmonary disease) [facility])     Patient Active Problem List   Diagnosis Date Noted  . Mediastinitis     History reviewed. No pertinent surgical history.  Prior to Admission medications   Medication Sig Start Date End Date Taking? Authorizing Provider  albuterol (PROVENTIL HFA;VENTOLIN HFA) 108 (90 Base) MCG/ACT inhaler Inhale 2 puffs into the lungs every 6 (six) hours as needed for wheezing or shortness of breath. 11/15/16  Yes Delman Kitten, MD  budesonide-formoterol (SYMBICORT) 160-4.5 MCG/ACT inhaler Inhale 2 puffs into the lungs 2 (two) times daily.   Yes [provider]  Spacer/Aero-Holding Chambers (AEROCHAMBER PLUS) inhaler Use as instructed 11/15/16  Yes Delman Kitten, MD  benzonatate (TESSALON PERLES) 100 MG capsule Take 1 capsule (100 mg total) by mouth 3 (three) times daily as needed for up to 7 days for cough. 07/09/18 07/16/18  Lannie Fields, PA-C    Allergies Other  No family history on file.  Social History Social History   Tobacco Use  . Smoking status: Former Research scientist (life sciences)  . Smokeless tobacco: Never Used  Substance Use Topics  . Alcohol use: Yes  . Drug use: Not on file    Review of  Systems Constitutional: No fever/chills Eyes: No visual changes. ENT: Positive sore throat neck swelling and redness Cardiovascular: Denies chest pain. Respiratory: Denies shortness of breath. Gastrointestinal: No abdominal pain.  No nausea, no vomiting.  No diarrhea.  No constipation. Genitourinary: Negative for dysuria. Musculoskeletal: Negative for neck pain.  Negative for back pain. Integumentary: Negative for rash. Neurological: Negative for headaches, focal weakness or numbness.   ____________________________________________   PHYSICAL EXAM:  VITAL SIGNS: ED Triage Vitals  Enc Vitals Group     BP 07/12/18 0632 (!) 136/118     Pulse Rate 07/12/18 0632 (!) 110     Resp 07/12/18 0632 18     Temp 07/12/18 0632 97.8 F (36.6 C)     Temp Source 07/12/18 0632 Oral     SpO2 07/12/18 0632 97 %     Weight 07/12/18 0630 65.8 kg (145 lb)     Height 07/12/18 0630 1.727 m ('5\' 8"'$ )     Head Circumference --      Peak Flow --      Pain Score 07/12/18 0638 10     Pain Loc --      Pain Edu? --      Excl. in [place]? --     Constitutional: Alert and oriented.  Apparent discomfort  eyes: Conjunctivae are normal. PERRL. EOMI. Head: Atraumatic. Mouth/Throat: Mucous membranes are moist. Oropharynx non-erythematous. Neck: Positive hoarseness, edema/erythema to the anterior neck, indurated however no flocculence or palpable gas. Cardiovascular: Tachycardia, regular rhythm. Good peripheral circulation. Grossly normal heart sounds. Respiratory: Normal respiratory effort.  No retractions. Lungs CTAB. Gastrointestinal: Soft and nontender. No distention.  Musculoskeletal: No lower extremity tenderness nor edema. No gross deformities of extremities. Neurologic:  Hoarse. No gross focal neurologic deficits are appreciated.  Skin: Blanching erythema anterior neck. Psychiatric: Mood and affect are normal. Speech and behavior are normal.  ____________________________________________   LABS (all  labs ordered are listed, but only abnormal results are displayed)  Labs Reviewed  CBC WITH DIFFERENTIAL/PLATELET - Abnormal; Notable for the following components:      Result Value   WBC 11.2 (*)    Monocytes Absolute 1.3 (*)    Abs Immature Granulocytes 0.90 (*)    All other components within normal limits  COMPREHENSIVE METABOLIC PANEL - Abnormal; Notable for the following components:   CO2 21 (*)    Glucose, Bld 141 (*)    BUN 30 (*)    Creatinine, Ser 1.33 (*)    Total Bilirubin 1.3 (*)    All other components within normal limits  LACTIC ACID, PLASMA - Abnormal; Notable for the following components:   Lactic Acid, Venous 2.8 (*)    All other components within normal limits  LACTIC ACID, PLASMA - Abnormal; Notable for the following components:   Lactic Acid, Venous 2.7 (*)    All other components within normal limits  GROUP A STREP BY PCR  CULTURE, BLOOD (ROUTINE X 2)  CULTURE, BLOOD (ROUTINE X 2)  TROPONIN I  INFLUENZA PANEL BY PCR (TYPE A & B)  TSH   ____________________________________________  EKG  ED ECG REPORT I, Maxwell N BROWN, the attending physician, personally viewed and interpreted this ECG.   Date: 07/12/2018  EKG Time: 6:39AM  Rate: 110  Rhythm: Sinus tachycardia  Axis: Rightward axis  Intervals: Normal  ST&T Change: None  ____________________________________________  RADIOLOGY I, Bastrop N BROWN, personally viewed and evaluated these images (plain radiographs) as part of my medical decision making, as well as reviewing the written report by the radiologist.  ED MD interpretation: Retropharyngeal fluid collection in the midline extending to the left rim-enhancing likely representing abscess.  No tonsillar abscess noted edema extends to the superior mediastinum compatible with mediastinitis per radiologist interpretation of CT neck with and without contrast.  Official radiology report(s): Ct Soft Tissue Neck W Contrast  Result Date:  07/12/2018 CLINICAL DATA:  Sore throat difficulty swallowing. Recent diagnosis influenza. Fever leukocytosis neck swelling EXAM: CT NECK WITH CONTRAST TECHNIQUE: Multidetector CT imaging of the neck was performed using the standard protocol following the bolus administration of intravenous contrast. CONTRAST:  45m OMNIPAQUE IOHEXOL 300 MG/ML  SOLN COMPARISON:  None. FINDINGS: Pharynx and larynx: Tongue and tonsils normal. Retropharyngeal edema left greater than right. Rim enhancing fluid collection in the retropharyngeal soft tissues to the left of midline extending into the lateral neck. Mild mass-effect on the posterior pharynx. Mild edema in the right retropharyngeal soft tissues. This extends into the superior mediastinum where there is diffuse edema. Airway intact. Epiglottis and larynx normal. Salivary glands: No inflammation, mass, or stone. Thyroid: Negative Lymph nodes: Reactive lymph nodes in the neck bilaterally. No pathologic lymph nodes. Vascular: Normal arterial and venous enhancement. No venous thrombosis. Limited intracranial: Negative Visualized orbits: Negative Mastoids and visualized paranasal sinuses: Mucous retention cyst right maxillary sinus. Mucosal edema in the paranasal sinuses. Mastoid sinus clear bilaterally. Skeleton: Cervical spondylosis. No acute skeletal abnormality. Multiple dental caries. Upper chest: Retropharyngeal edema extends into the superior mediastinum. There is edema surrounding the trachea diffusely. No airway narrowing. Low-density lymph nodes are present  in the superior mediastinum. No abscess. Scan extends to the distal trachea. Apical blebs bilaterally without infiltrate. Other: Diffuse edema in the anterior neck. IMPRESSION: Retropharyngeal fluid collection in the midline extending to the left. This has rim enhancing wall and likely represents an abscess. No evidence of tonsillar abscess. In addition, there is diffuse edema in the anterior neck. Edema low-density  lymph nodes extend into the superior mediastinum compatible with mediastinitis. Mild reactive adenopathy in the neck. CT chest with contrast recommended to evaluate the remainder of the mediastinum. ENT consultation and antibiotics recommended. These results were called by telephone at the time of interpretation on 07/12/2018 at 7:57 am to Dr. Marjean Donna , who verbally acknowledged these results. Electronically Signed   By: Franchot Gallo M.D.   On: 07/12/2018 08:00   Ct Chest W Contrast  Result Date: 07/12/2018 CLINICAL DATA:  Chest pain, cough. EXAM: CT CHEST WITH CONTRAST TECHNIQUE: Multidetector CT imaging of the chest was performed during intravenous contrast administration. CONTRAST:  15m OMNIPAQUE IOHEXOL 300 MG/ML  SOLN COMPARISON:  CT scan of same day.  Radiograph of same day. FINDINGS: Cardiovascular: No significant vascular findings. Normal heart size. No pericardial effusion. Mediastinum/Nodes: Thyroid gland is unremarkable. As noted on prior CT scan, ill-defined densities and inflammatory changes are seen in the mediastinum. Subcarinal adenopathy measuring at least 2 cm is noted. Probable fluid collection measuring 17 x 10 mm is noted in right suprahilar region concerning for possible abscess or large necrotic lymph node. No axillary adenopathy is noted. There also appears to be diffuse wall thickening of the esophagus; is uncertain if this represents primary esophagitis or secondary to surrounding inflammation. Lung: No pneumothorax or pleural effusion is noted. Bulla formation is noted in both upper lobes, right greater than left. Mild bilateral posterior basilar subsegmental atelectasis is noted. Inflammatory changes are noted medially in right upper lobe in the perihilar region in vicinity of right hilar fluid collection described above. 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 Upper Abdomen: No acute abnormality. Musculoskeletal: No chest wall abnormality. No acute or significant osseous findings. IMPRESSION: Irregular adenopathy and inflammatory changes are seen throughout the mediastinum, most consistent with mediastinitis. 2 cm subcarinal adenopathy is noted which most likely is inflammatory in origin. Probable 17 x 10 mm fluid collection is noted in right suprahilar region concerning for possible abscess or large necrotic lymph node. 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 Mild bilateral posterior basilar subsegmental atelectasis is noted. Diffuse esophageal wall thickening is noted which may represent primary esophagitis or be secondary to surrounding inflammation. Endoscopy may be performed for further evaluation. Bulla formation is noted in both lungs, right greater than left. Electronically Signed   By: JMarijo Conception M.D.   On: 07/12/2018 09:07   Dg Chest Portable 1 View  Result Date: 07/12/2018 CLINICAL DATA:  Fever with sore throat EXAM: PORTABLE CHEST 1 VIEW COMPARISON:  November 15, 2016 FINDINGS: There is atelectatic change in the bases. The lungs elsewhere are clear. Heart size and pulmonary vascular normal. There is slight mediastinal fullness which may be secondary to portable technique. No overt adenopathy seen. No bone lesions. IMPRESSION: 1.  Atelectatic change  in the bases.  No edema or consolidation. 2.  Heart size normal. 3. Slight mediastinal prominence may be due to portable technique. Would advise upright PA and lateral examination when patient is able to exclude the possibility of adenopathy in these areas of relative prominence. Electronically Signed   By: Lowella Grip III M.D.   On: 07/12/2018 07:32        .Critical Care Performed by: Gregor Hams, MD Authorized by: Gregor Hams, MD   Critical care provider statement:    Critical care time (minutes):  30   Critical care time was exclusive of:  Separately billable procedures and treating other patients   Critical care was necessary to treat or prevent imminent or life-threatening deterioration of the following conditions:  Sepsis   Critical care was time spent personally by me on the following activities:  Development of treatment plan with patient or surrogate, discussions with consultants, evaluation of patient's response to treatment, examination of patient, obtaining history from patient or surrogate, ordering and performing treatments and interventions, ordering and review of laboratory studies, ordering and review of radiographic studies, pulse oximetry, re-evaluation of patient's condition and review of old charts   I assumed direction of critical care for this patient from another provider in my specialty: no       ____________________________________________   INITIAL IMPRESSION / ASSESSMENT AND PLAN / ED COURSE  As part of my medical decision making, I reviewed the following data within the electronic MEDICAL RECORD NUMBER   53 year old male presenting with above-stated history and physical exam differential diagnosis includes Lemierre's, Ludwick's angina, retropharyngeal/tonsillar abscess, mediastinitis immediately after my evaluation patient was given clindamycin 900 mg IV as well as Decadron 20 mg IV.  CT neck with contrast revealed findings consistent with retropharyngeal abscesses with possible mediastinitis.  Discussed the case with Dr. Nyoka Cowden radiologist who also recommended a CT of the chest which was ordered.  Patient's coverage broadened with Zosyn 3.375 mg IV as well.  Met sepsis criteria and as such sepsis protocol was initiated.  Spoke with the patient and his wife at length and informed him of all clinical  findings thus far.  I informed them that there may be a possibility that they will be transferred to a tertiary care center.  Patient protecting airway well at this time oxygen saturation 100% on room air without increased work of breathing.  Patient signed out to Dr. Jimmye Norman who will consult CT surgery and ENT regarding the patient's case.         ____________________________________________  FINAL CLINICAL IMPRESSION(S) / ED DIAGNOSES  Final diagnoses:  Retropharyngeal abscess  Mediastinitis  Sepsis, due to unspecified organism, unspecified whether acute organ dysfunction present [facility])     MEDICATIONS GIVEN DURING THIS VISIT:  Medications  dexamethasone (DECADRON) injection 20 mg (20 mg Intravenous Given 07/12/18 0706)  morphine 2 MG/ML injection 2 mg (2 mg Intravenous Given 07/12/18 0707)  ondansetron (ZOFRAN) injection 4 mg (4 mg Intravenous Given 07/12/18 0706)  sodium chloride 0.9 % bolus 1,000 mL (0 mLs Intravenous Stopped 07/12/18 0845)  clindamycin (CLEOCIN) IVPB 900 mg (0 mg Intravenous Stopped 07/12/18 0727)  iohexol (OMNIPAQUE) 300 MG/ML solution 75 mL (75 mLs Intravenous Contrast Given 07/12/18 0730)  vancomycin (VANCOCIN) IVPB 1000 mg/200 mL premix (0 mg Intravenous Stopped 07/12/18 0917)  0.9 %  sodium chloride infusion ( Intravenous Transfusing/Transfer 07/12/18 1005)  piperacillin-tazobactam (ZOSYN) IVPB 3.375 g (0 g Intravenous Stopped 07/12/18 0846)  iohexol (OMNIPAQUE) 300 MG/ML solution 60 mL (  60 mLs Intravenous Contrast Given 07/12/18 0818)  morphine 4 MG/ML injection 4 mg (4 mg Intravenous Given 07/12/18 0840)  LORazepam (ATIVAN) injection 0.5 mg (0.5 mg Intravenous Given 07/12/18 0909)  morphine 4 MG/ML injection 4 mg (4 mg Intravenous Given 07/12/18 0223)     ED Discharge Orders    None       Note:  This document was prepared using Dragon voice recognition software and may include unintentional dictation errors.   Gregor Hams, MD 07/12/18 2357     Gregor Hams, MD 07/12/18 2358

## 2018-07-12 NOTE — ED Provider Notes (Signed)
IMPRESSION: Retropharyngeal fluid collection in the midline extending to the left. This has rim enhancing wall and likely represents an abscess. No evidence of tonsillar abscess. In addition, there is diffuse edema in the anterior neck. Edema low-density lymph nodes extend into the superior mediastinum compatible with mediastinitis. Mild reactive adenopathy in the neck.  CT chest with contrast recommended to evaluate the remainder of the mediastinum. ENT consultation and antibiotics recommended.  These results were called by telephone at the time of interpretation on 07/12/2018 at 7:57 am to Dr. Bayard Males , who verbally acknowledged these results.  I will discuss with ENT and thoracic surgery.    Emily Filbert, MD 07/12/18 [phone]

## 2018-07-12 NOTE — ED Provider Notes (Signed)
IMPRESSION: Irregular adenopathy and inflammatory changes are seen throughout the mediastinum, most consistent with mediastinitis. 2 cm subcarinal adenopathy is noted which most likely is inflammatory in origin. Probable 17 x 10 mm fluid collection is noted in right suprahilar region concerning for possible abscess or large necrotic lymph node. 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  Mild bilateral posterior basilar subsegmental atelectasis is noted.  Diffuse esophageal wall thickening is noted which may represent primary esophagitis or be secondary to surrounding inflammation. Endoscopy may be performed for further evaluation.  Bulla formation is noted in both lungs, right greater than left.  Patient was evaluated by Dr. Thelma Barge, we will attempt transfer to a tertiary care center.     Emily Filbert, MD 07/12/18 [phone]

## 2018-07-12 NOTE — ED Notes (Signed)
Pt in NAD at time of departure with [facility], 98% on RA.

## 2018-07-12 NOTE — ED Notes (Signed)
COPY  OF  XRAY  SENT WITH  PT  TO  [facility]  / ALSO  [facility]  WITH  [facility]

## 2018-07-17 LAB — CULTURE, BLOOD (ROUTINE X 2)
Culture: NO GROWTH
Culture: NO GROWTH
Special Requests: ADEQUATE

## 2018-07-18 MED ORDER — THIAMINE HCL 100 MG/ML IJ SOLN
200.00 | INTRAMUSCULAR | Status: DC
Start: 2018-07-18 — End: 2018-07-18

## 2018-07-18 MED ORDER — CEFEPIME HCL 2 GM/100ML IV SOLN
2.00 | INTRAVENOUS | Status: DC
Start: 2018-07-18 — End: 2018-07-18

## 2018-07-18 MED ORDER — MAGNESIUM SULFATE 2 GM/50ML IV SOLN
2.00 | INTRAVENOUS | Status: DC
Start: ? — End: 2018-07-18

## 2018-07-18 MED ORDER — GENERIC EXTERNAL MEDICATION
0.00 | Status: DC
Start: ? — End: 2018-07-18

## 2018-07-18 MED ORDER — FAMOTIDINE 20 MG/2ML IV SOLN
20.00 | INTRAVENOUS | Status: DC
Start: 2018-07-18 — End: 2018-07-18

## 2018-07-18 MED ORDER — GENERIC EXTERNAL MEDICATION
2.00 | Status: DC
Start: ? — End: 2018-07-18

## 2018-07-18 MED ORDER — PHENOBARBITAL SODIUM 65 MG/ML IJ SOLN
65.00 | INTRAMUSCULAR | Status: DC
Start: 2018-07-18 — End: 2018-07-18

## 2018-07-18 MED ORDER — BUDESONIDE 0.25 MG/2ML IN SUSP
0.25 | RESPIRATORY_TRACT | Status: DC
Start: 2018-07-19 — End: 2018-07-18

## 2018-07-18 MED ORDER — PROPOFOL 100 MG/10ML IV EMUL
.00 | INTRAVENOUS | Status: DC
Start: ? — End: 2018-07-18

## 2018-07-18 MED ORDER — FOLIC ACID 5 MG/ML IJ SOLN
1.00 | INTRAMUSCULAR | Status: DC
Start: 2018-07-19 — End: 2018-07-18

## 2018-07-18 MED ORDER — ATORVASTATIN CALCIUM 40 MG PO TABS
40.00 | ORAL_TABLET | ORAL | Status: DC
Start: 2018-07-18 — End: 2018-07-18

## 2018-07-18 MED ORDER — ARFORMOTEROL TARTRATE 15 MCG/2ML IN NEBU
15.00 | INHALATION_SOLUTION | RESPIRATORY_TRACT | Status: DC
Start: 2018-07-18 — End: 2018-07-18

## 2018-07-18 MED ORDER — GENERIC EXTERNAL MEDICATION
1.00 | Status: DC
Start: ? — End: 2018-07-18

## 2018-07-18 MED ORDER — ENOXAPARIN SODIUM 30 MG/0.3ML ~~LOC~~ SOLN
30.00 | SUBCUTANEOUS | Status: DC
Start: 2018-07-18 — End: 2018-07-18

## 2018-07-18 MED ORDER — KETAMINE HCL 10 MG/ML IJ SOLN
.30 | INTRAMUSCULAR | Status: DC
Start: ? — End: 2018-07-18

## 2018-07-18 MED ORDER — POTASSIUM CHLORIDE 20 MEQ/100ML IV SOLN
20.00 | INTRAVENOUS | Status: DC
Start: ? — End: 2018-07-18

## 2018-07-18 MED ORDER — RISPERIDONE 1 MG PO TBDP
2.00 | ORAL_TABLET | ORAL | Status: DC
Start: 2018-07-18 — End: 2018-07-18

## 2018-07-18 MED ORDER — GENERIC EXTERNAL MEDICATION
5.00 | Status: DC
Start: ? — End: 2018-07-18

## 2018-07-18 MED ORDER — GENERIC EXTERNAL MEDICATION
15.00 | Status: DC
Start: ? — End: 2018-07-18

## 2018-07-18 MED ORDER — GENERIC EXTERNAL MEDICATION
12.50 | Status: DC
Start: ? — End: 2018-07-18

## 2018-07-18 MED ORDER — FENTANYL CITRATE 2500 MCG/50ML IV SOLN
0.00 | INTRAVENOUS | Status: DC
Start: ? — End: 2018-07-18

## 2018-07-18 MED ORDER — METRONIDAZOLE IN NACL 5-0.79 MG/ML-% IV SOLN
500.00 | INTRAVENOUS | Status: DC
Start: 2018-07-18 — End: 2018-07-18

## 2018-07-18 MED ORDER — THIAMINE HCL 100 MG PO TABS
100.00 | ORAL_TABLET | ORAL | Status: DC
Start: 2018-07-22 — End: 2018-07-18

## 2018-07-18 MED ORDER — ACETAMINOPHEN 650 MG/20.3ML PO SOLN
650.00 | ORAL | Status: DC
Start: ? — End: 2018-07-18

## 2018-07-18 MED ORDER — OXYCODONE HCL 5 MG/5ML PO SOLN
10.00 | ORAL | Status: DC
Start: 2018-07-18 — End: 2018-07-18

## 2018-07-18 MED ORDER — ASPIRIN 81 MG PO CHEW
81.00 | CHEWABLE_TABLET | ORAL | Status: DC
Start: 2018-07-19 — End: 2018-07-18

## 2018-07-18 MED ORDER — GENERIC EXTERNAL MEDICATION
.50 | Status: DC
Start: ? — End: 2018-07-18

## 2018-07-18 MED ORDER — CHLORHEXIDINE GLUCONATE 0.12 % MT SOLN
5.00 | OROMUCOSAL | Status: DC
Start: 2018-07-18 — End: 2018-07-18

## 2018-07-18 MED ORDER — RISPERIDONE 1 MG PO TBDP
1.00 | ORAL_TABLET | ORAL | Status: DC
Start: ? — End: 2018-07-18

## 2018-07-18 MED ORDER — GENERIC EXTERNAL MEDICATION
1500.00 | Status: DC
Start: 2018-07-18 — End: 2018-07-18

## 2018-08-17 ENCOUNTER — Inpatient Hospital Stay
Admission: AD | Admit: 2018-08-17 | Discharge: 2018-08-24 | DRG: 177 | Disposition: A | Discharge status: Home Health | Payer: Medicaid Other | Source: Other Acute Inpatient Hospital | Attending: Internal Medicine | Admitting: Internal Medicine

## 2018-08-17 DIAGNOSIS — Z681 Body mass index (BMI) 19 or less, adult: Secondary | ICD-10-CM | POA: Diagnosis not present

## 2018-08-17 DIAGNOSIS — R1319 Other dysphagia: Secondary | ICD-10-CM | POA: Diagnosis present

## 2018-08-17 DIAGNOSIS — J853 Abscess of mediastinum: Secondary | ICD-10-CM | POA: Diagnosis present

## 2018-08-17 DIAGNOSIS — E43 Unspecified severe protein-calorie malnutrition: Secondary | ICD-10-CM

## 2018-08-17 DIAGNOSIS — Z87891 Personal history of nicotine dependence: Secondary | ICD-10-CM

## 2018-08-17 DIAGNOSIS — J3801 Paralysis of vocal cords and larynx, unilateral: Secondary | ICD-10-CM | POA: Diagnosis present

## 2018-08-17 DIAGNOSIS — R739 Hyperglycemia, unspecified: Secondary | ICD-10-CM | POA: Diagnosis present

## 2018-08-17 DIAGNOSIS — Z978 Presence of other specified devices: Secondary | ICD-10-CM

## 2018-08-17 DIAGNOSIS — Z931 Gastrostomy status: Secondary | ICD-10-CM

## 2018-08-17 DIAGNOSIS — D649 Anemia, unspecified: Secondary | ICD-10-CM | POA: Diagnosis present

## 2018-08-17 DIAGNOSIS — I248 Other forms of acute ischemic heart disease: Secondary | ICD-10-CM | POA: Diagnosis present

## 2018-08-17 DIAGNOSIS — Z23 Encounter for immunization: Secondary | ICD-10-CM

## 2018-08-17 DIAGNOSIS — L8990 Pressure ulcer of unspecified site, unspecified stage: Secondary | ICD-10-CM

## 2018-08-17 DIAGNOSIS — I4891 Unspecified atrial fibrillation: Secondary | ICD-10-CM | POA: Diagnosis present

## 2018-08-17 DIAGNOSIS — J390 Retropharyngeal and parapharyngeal abscess: Secondary | ICD-10-CM | POA: Diagnosis present

## 2018-08-17 DIAGNOSIS — E878 Other disorders of electrolyte and fluid balance, not elsewhere classified: Secondary | ICD-10-CM | POA: Diagnosis present

## 2018-08-17 DIAGNOSIS — Z7951 Long term (current) use of inhaled steroids: Secondary | ICD-10-CM

## 2018-08-17 DIAGNOSIS — Z7982 Long term (current) use of aspirin: Secondary | ICD-10-CM

## 2018-08-17 DIAGNOSIS — Z8709 Personal history of other diseases of the respiratory system: Secondary | ICD-10-CM

## 2018-08-17 DIAGNOSIS — Z95828 Presence of other vascular implants and grafts: Secondary | ICD-10-CM

## 2018-08-17 DIAGNOSIS — I252 Old myocardial infarction: Secondary | ICD-10-CM | POA: Diagnosis not present

## 2018-08-17 DIAGNOSIS — Z4801 Encounter for change or removal of surgical wound dressing: Secondary | ICD-10-CM | POA: Diagnosis not present

## 2018-08-17 DIAGNOSIS — Z8614 Personal history of Methicillin resistant Staphylococcus aureus infection: Secondary | ICD-10-CM | POA: Diagnosis not present

## 2018-08-17 DIAGNOSIS — R64 Cachexia: Secondary | ICD-10-CM | POA: Diagnosis present

## 2018-08-17 DIAGNOSIS — J449 Chronic obstructive pulmonary disease, unspecified: Secondary | ICD-10-CM | POA: Diagnosis present

## 2018-08-17 DIAGNOSIS — J90 Pleural effusion, not elsewhere classified: Secondary | ICD-10-CM | POA: Diagnosis present

## 2018-08-17 DIAGNOSIS — Z930 Tracheostomy status: Secondary | ICD-10-CM

## 2018-08-17 DIAGNOSIS — Z79899 Other long term (current) drug therapy: Secondary | ICD-10-CM

## 2018-08-17 DIAGNOSIS — K7689 Other specified diseases of liver: Secondary | ICD-10-CM | POA: Diagnosis present

## 2018-08-18 ENCOUNTER — Encounter: Payer: Self-pay | Admitting: Internal Medicine

## 2018-08-18 ENCOUNTER — Other Ambulatory Visit: Payer: Self-pay

## 2018-08-18 ENCOUNTER — Inpatient Hospital Stay: Payer: Medicaid Other

## 2018-08-18 DIAGNOSIS — Z87891 Personal history of nicotine dependence: Secondary | ICD-10-CM

## 2018-08-18 DIAGNOSIS — Z930 Tracheostomy status: Secondary | ICD-10-CM

## 2018-08-18 DIAGNOSIS — Z8614 Personal history of Methicillin resistant Staphylococcus aureus infection: Secondary | ICD-10-CM

## 2018-08-18 DIAGNOSIS — Z95828 Presence of other vascular implants and grafts: Secondary | ICD-10-CM

## 2018-08-18 DIAGNOSIS — J869 Pyothorax without fistula: Secondary | ICD-10-CM

## 2018-08-18 DIAGNOSIS — F1910 Other psychoactive substance abuse, uncomplicated: Secondary | ICD-10-CM

## 2018-08-18 DIAGNOSIS — Z978 Presence of other specified devices: Secondary | ICD-10-CM

## 2018-08-18 DIAGNOSIS — J449 Chronic obstructive pulmonary disease, unspecified: Secondary | ICD-10-CM

## 2018-08-18 DIAGNOSIS — J853 Abscess of mediastinum: Principal | ICD-10-CM

## 2018-08-18 DIAGNOSIS — J390 Retropharyngeal and parapharyngeal abscess: Secondary | ICD-10-CM

## 2018-08-18 DIAGNOSIS — Z931 Gastrostomy status: Secondary | ICD-10-CM

## 2018-08-18 LAB — COMPREHENSIVE METABOLIC PANEL
ALT: 68 U/L — ABNORMAL HIGH (ref 0–44)
ANION GAP: 9 (ref 5–15)
AST: 52 U/L — ABNORMAL HIGH (ref 15–41)
Albumin: 3 g/dL — ABNORMAL LOW (ref 3.5–5.0)
Alkaline Phosphatase: 67 U/L (ref 38–126)
BUN: 11 mg/dL (ref 6–20)
CO2: 32 mmol/L (ref 22–32)
Calcium: 8.7 mg/dL — ABNORMAL LOW (ref 8.9–10.3)
Chloride: 96 mmol/L — ABNORMAL LOW (ref 98–111)
Creatinine, Ser: 0.51 mg/dL — ABNORMAL LOW (ref 0.61–1.24)
GFR calc Af Amer: >60 mL/min (ref >60)
GFR calc non Af Amer: >60 mL/min (ref >60)
Glucose, Bld: 117 mg/dL — ABNORMAL HIGH (ref 70–99)
Potassium: 3.8 mmol/L (ref 3.5–5.1)
Sodium: 137 mmol/L (ref 135–145)
Total Bilirubin: 0.5 mg/dL (ref 0.3–1.2)
Total Protein: 6.7 g/dL (ref 6.5–8.1)

## 2018-08-18 LAB — CBC WITH DIFFERENTIAL/PLATELET
Abs Immature Granulocytes: 0.05 10*3/uL (ref 0.00–0.07)
BASOS ABS: 0.1 10*3/uL (ref 0.0–0.1)
Basophils Relative: 1 %
Eosinophils Absolute: 0.5 10*3/uL (ref 0.0–0.5)
Eosinophils Relative: 5 %
HCT: 31.8 % — ABNORMAL LOW (ref 39.0–52.0)
Hemoglobin: 10.1 g/dL — ABNORMAL LOW (ref 13.0–17.0)
Immature Granulocytes: 1 %
Lymphocytes Relative: 17 %
Lymphs Abs: 1.8 10*3/uL (ref 0.7–4.0)
MCH: 28.4 pg (ref 26.0–34.0)
MCHC: 31.8 g/dL (ref 30.0–36.0)
MCV: 89.3 fL (ref 80.0–100.0)
Monocytes Absolute: 0.9 10*3/uL (ref 0.1–1.0)
Monocytes Relative: 8 %
NRBC: 0 % (ref 0.0–0.2)
Neutro Abs: 7.6 10*3/uL (ref 1.7–7.7)
Neutrophils Relative %: 68 %
Platelets: 365 10*3/uL (ref 150–400)
RBC: 3.56 MIL/uL — ABNORMAL LOW (ref 4.22–5.81)
RDW: 15.9 % — ABNORMAL HIGH (ref 11.5–15.5)
WBC: 10.9 10*3/uL — ABNORMAL HIGH (ref 4.0–10.5)

## 2018-08-18 LAB — PROCALCITONIN

## 2018-08-18 LAB — MAGNESIUM: Magnesium: 1.7 mg/dL (ref 1.7–2.4)

## 2018-08-18 LAB — TROPONIN I: Troponin I: <0.03 ng/mL (ref <0.03)

## 2018-08-18 LAB — PHOSPHORUS: Phosphorus: 4.2 mg/dL (ref 2.5–4.6)

## 2018-08-18 LAB — LACTIC ACID, PLASMA: LACTIC ACID, VENOUS: 0.9 mmol/L (ref 0.5–1.9)

## 2018-08-18 MED ORDER — ARFORMOTEROL TARTRATE 15 MCG/2ML IN NEBU
15.0000 ug | INHALATION_SOLUTION | Freq: Two times a day (BID) | RESPIRATORY_TRACT | Status: DC
  Administered 2018-08-18 – 2018-08-24 (×13): 15 ug via RESPIRATORY_TRACT
  Filled 2018-08-18 (×14): qty 2

## 2018-08-18 MED ORDER — VITAMIN C 500 MG PO TABS
250.0000 mg | ORAL_TABLET | Freq: Two times a day (BID) | ORAL | Status: DC
  Administered 2018-08-19 – 2018-08-24 (×11): 250 mg
  Filled 2018-08-18 (×11): qty 1

## 2018-08-18 MED ORDER — ORAL CARE MOUTH RINSE
15.0000 mL | Freq: Two times a day (BID) | OROMUCOSAL | Status: DC
  Administered 2018-08-18 – 2018-08-24 (×8): 15 mL via OROMUCOSAL

## 2018-08-18 MED ORDER — ASPIRIN 81 MG PO CHEW
81.0000 mg | CHEWABLE_TABLET | Freq: Every day | ORAL | Status: DC
  Administered 2018-08-18 – 2018-08-24 (×7): 81 mg
  Filled 2018-08-18 (×7): qty 1

## 2018-08-18 MED ORDER — MELATONIN 5 MG PO TABS
5.0000 mg | ORAL_TABLET | Freq: Every day | ORAL | Status: DC
  Administered 2018-08-19 – 2018-08-23 (×5): 5 mg
  Filled 2018-08-18 (×8): qty 1

## 2018-08-18 MED ORDER — LACTATED RINGERS IV SOLN
INTRAVENOUS | Status: DC
  Administered 2018-08-18: 05:00:00 via INTRAVENOUS

## 2018-08-18 MED ORDER — CHLORHEXIDINE GLUCONATE 0.12 % MT SOLN
5.0000 mL | Freq: Two times a day (BID) | OROMUCOSAL | Status: DC
  Administered 2018-08-18 – 2018-08-24 (×13): 5 mL via OROMUCOSAL
  Filled 2018-08-18 (×13): qty 15

## 2018-08-18 MED ORDER — FOLIC ACID 1 MG PO TABS
1.0000 mg | ORAL_TABLET | Freq: Every day | ORAL | Status: DC
  Administered 2018-08-18 – 2018-08-24 (×7): 1 mg
  Filled 2018-08-18 (×7): qty 1

## 2018-08-18 MED ORDER — ONDANSETRON HCL 4 MG/2ML IJ SOLN: 4.0000 mg | Freq: Four times a day (QID) | INTRAMUSCULAR | Status: DC | PRN

## 2018-08-18 MED ORDER — SALINE SPRAY 0.65 % NA SOLN
1.0000 | NASAL | Status: DC | PRN
  Filled 2018-08-18: qty 44

## 2018-08-18 MED ORDER — CARVEDILOL 3.125 MG PO TABS
3.1250 mg | ORAL_TABLET | Freq: Two times a day (BID) | ORAL | Status: DC
  Administered 2018-08-18 – 2018-08-23 (×11): 3.125 mg
  Filled 2018-08-18 (×14): qty 1

## 2018-08-18 MED ORDER — POLYETHYLENE GLYCOL 3350 17 G PO PACK
17.0000 g | PACK | Freq: Two times a day (BID) | ORAL | Status: DC
  Administered 2018-08-18 – 2018-08-22 (×4): 17 g
  Filled 2018-08-18 (×8): qty 1

## 2018-08-18 MED ORDER — ENOXAPARIN SODIUM 40 MG/0.4ML ~~LOC~~ SOLN
40.0000 mg | SUBCUTANEOUS | Status: DC
  Administered 2018-08-19: 01:00:00 40 mg via SUBCUTANEOUS
  Filled 2018-08-18: qty 0.4

## 2018-08-18 MED ORDER — JEVITY 1.5 CAL/FIBER PO LIQD
1000.0000 mL | ORAL | Status: DC
  Administered 2018-08-18: 1000 mL

## 2018-08-18 MED ORDER — SODIUM CHLORIDE 0.9 % IV SOLN
1.0000 g | INTRAVENOUS | Status: DC
  Administered 2018-08-18 – 2018-08-19 (×2): 1000 mg via INTRAVENOUS
  Filled 2018-08-18 (×3): qty 1

## 2018-08-18 MED ORDER — HYDROMORPHONE HCL 1 MG/ML IJ SOLN: 0.5000 mg | INTRAMUSCULAR | Status: DC | PRN

## 2018-08-18 MED ORDER — MORPHINE SULFATE (PF) 4 MG/ML IV SOLN
4.0000 mg | Freq: Once | INTRAVENOUS | Status: AC
  Administered 2018-08-18: 4 mg via INTRAVENOUS
  Filled 2018-08-18: qty 1

## 2018-08-18 MED ORDER — ACETAMINOPHEN 650 MG RE SUPP: 650.0000 mg | Freq: Four times a day (QID) | RECTAL | Status: DC | PRN

## 2018-08-18 MED ORDER — FLUCONAZOLE 40 MG/ML PO SUSR
400.0000 mg | Freq: Every day | ORAL | Status: DC
  Administered 2018-08-18 – 2018-08-24 (×7): 400 mg
  Filled 2018-08-18 (×13): qty 10

## 2018-08-18 MED ORDER — IPRATROPIUM-ALBUTEROL 0.5-2.5 (3) MG/3ML IN SOLN
3.0000 mL | RESPIRATORY_TRACT | Status: DC | PRN
  Administered 2018-08-22 (×2): 3 mL via RESPIRATORY_TRACT
  Filled 2018-08-18: qty 30
  Filled 2018-08-18 (×2): qty 3

## 2018-08-18 MED ORDER — HYDROMORPHONE HCL 1 MG/ML IJ SOLN
1.0000 mg | INTRAMUSCULAR | Status: DC | PRN
  Administered 2018-08-18 – 2018-08-24 (×40): 1 mg via INTRAVENOUS
  Filled 2018-08-18 (×40): qty 1

## 2018-08-18 MED ORDER — ADULT MULTIVITAMIN LIQUID CH
15.0000 mL | Freq: Every day | ORAL | Status: DC
  Administered 2018-08-18: 15 mL
  Filled 2018-08-18: qty 15

## 2018-08-18 MED ORDER — AMIODARONE HCL 200 MG PO TABS
200.0000 mg | ORAL_TABLET | Freq: Every day | ORAL | Status: DC
  Administered 2018-08-18 – 2018-08-24 (×7): 200 mg
  Filled 2018-08-18 (×7): qty 1

## 2018-08-18 MED ORDER — PSYLLIUM 95 % PO PACK
1.0000 | PACK | Freq: Two times a day (BID) | ORAL | Status: DC
  Administered 2018-08-18 – 2018-08-22 (×8): 1
  Filled 2018-08-18 (×16): qty 1

## 2018-08-18 MED ORDER — OLANZAPINE 2.5 MG PO TABS
2.5000 mg | ORAL_TABLET | Freq: Every evening | ORAL | Status: DC | PRN
  Administered 2018-08-19 – 2018-08-21 (×2): 2.5 mg
  Filled 2018-08-18 (×5): qty 1

## 2018-08-18 MED ORDER — VITAMIN B-1 100 MG PO TABS
100.0000 mg | ORAL_TABLET | Freq: Every day | ORAL | Status: DC
  Administered 2018-08-18 – 2018-08-24 (×7): 100 mg
  Filled 2018-08-18 (×7): qty 1

## 2018-08-18 MED ORDER — JUVEN PO PACK
1.0000 | PACK | Freq: Two times a day (BID) | ORAL | Status: DC
  Administered 2018-08-18 – 2018-08-24 (×13): 1

## 2018-08-18 MED ORDER — SENNOSIDES-DOCUSATE SODIUM 8.6-50 MG PO TABS: 1.0000 | ORAL_TABLET | Freq: Every evening | ORAL | Status: DC | PRN

## 2018-08-18 MED ORDER — SODIUM CHLORIDE 3 % IN NEBU
4.0000 mL | INHALATION_SOLUTION | Freq: Two times a day (BID) | RESPIRATORY_TRACT | Status: AC
  Administered 2018-08-18 – 2018-08-20 (×5): 4 mL via RESPIRATORY_TRACT
  Filled 2018-08-18 (×5): qty 4

## 2018-08-18 MED ORDER — MORPHINE SULFATE (PF) 4 MG/ML IV SOLN: 4.0000 mg | INTRAVENOUS | Status: DC | PRN

## 2018-08-18 MED ORDER — MORPHINE SULFATE (PF) 2 MG/ML IV SOLN: 2.0000 mg | INTRAVENOUS | Status: DC | PRN

## 2018-08-18 MED ORDER — BUDESONIDE 0.25 MG/2ML IN SUSP
0.2500 mg | Freq: Every day | RESPIRATORY_TRACT | Status: DC
  Administered 2018-08-18 – 2018-08-24 (×7): 0.25 mg via RESPIRATORY_TRACT
  Filled 2018-08-18 (×7): qty 2

## 2018-08-18 MED ORDER — FREE WATER
50.0000 mL | Status: DC
  Administered 2018-08-18 – 2018-08-23 (×30): 50 mL

## 2018-08-18 MED ORDER — ACETAMINOPHEN 325 MG PO TABS: 650.0000 mg | ORAL_TABLET | Freq: Four times a day (QID) | ORAL | Status: DC | PRN

## 2018-08-18 NOTE — Progress Notes (Signed)
Initial Nutrition Assessment  DOCUMENTATION CODES:   Severe malnutrition in context of acute illness/injury  INTERVENTION:   Jevity 1.5 @ goal rate of 86m/hr- Initiate at 232mhr and increase by 1057mr every 8 hours until goal rate is reached.   Free water flushes 17m7m4 hours  Regimen provides 1980kcal/day, 84g/day protein, 28g/day fiber, 1303ml58m free water  Juven Fruit Punch BID via tube, each serving provides 95kcal and 2.5g of protein (amino acids glutamine and arginine)  Vitamin C '250mg'$  BID via tube   Pt likely at refeed risk; recommend monitor K, Mg and P for 3 days after tube feed initiation    NUTRITION DIAGNOSIS:   Severe Malnutrition related to acute illness as evidenced by 23 percent weight loss in <2 months, moderate to severe fat depletions, moderate to severe muscle depletions.  GOAL:   Patient will meet greater than or equal to 90% of their needs  MONITOR:   Labs, Weight trends, TF tolerance, Skin, I & O's  REASON FOR ASSESSMENT:   Consult Enteral/tube feeding initiation and management  ASSESSMENT:   53 y/7male w/ PMHx COPD, substance abuse and prior MRSA neck infection. [facility] for retropharyngeal abscess w/ mediastinal extension/abscess + mediastinitis), [facility] continued [facility]-term management    -Intubated, OR for I&D, 07/12/2018 -Periop NSTEMI (peak Trop 14) 07/14/2018 -Periop AFib -Multiple neck washouts (03/01, 03/03) -07/28/2018 Echo w/ EF 45-50%, grade II diastolic dysfxn, hypokinesis -Prolonged intubation, tracheostomy + neck closure 07/31/2018 -08/05/2018 Echo w/ EF 55-60%, large pericardial effusion (w/o tamponade) -Chest tubes removed 08/07/2018 -Failed MBSS 08/10/2018 -RUE PICC 08/12/2018 -laparoscopic G-tube placement 08/15/2018 -08/15/2018 Echo w/ EF 45-50%, (-) pericardial effusion  Met with pt in room today. Pt is non verbal but is able to  communicate with hand gestures and by writing on a notepad; interview was limited secondary to this. Pt reports he is feeling ok today. Pt's main complaint is some soreness around his G-tube site. Pt is unsure of what tube feeds he has been receiving but reports he has been tolerating the tube feeds well. Pt denies any nausea or abdominal pain. Pt does report diarrhea that has now slowed down to about twice daily. Pt reports a 30lb weight loss over the past 2 months. There is limited weight history in chart but it appears pt's UBW is between 130-145lbs. Pt weighed 145lbs on 2/16; it appears pt has lost 33lbs(23%) in <2 months which is severe. Pt reports he is hungry today. RD will initiate tube feeds. Will start with Jevity as this formula will provide a good amount of fiber. Can switch to Osmolite if pt does not tolerate tube feeds. RD will add vitamin C and Juven to support wound healing.    Medications reviewed and include: aspirin, lovenox, diflucan, folic acid, miralax, psyllium, thiamine, ertapenem  Labs reviewed: K 3.8 wnl, P 4.2 wnl, Mg 1.7 wnl Wbc- 10.9(H), Hgb 10.1(L), Hct 31.8(L)  NUTRITION - FOCUSED PHYSICAL EXAM:    Most Recent Value  Orbital Region  Moderate depletion  Upper Arm Region  Severe depletion  Thoracic and Lumbar Region  Moderate depletion  Buccal Region  Moderate depletion  Temple Region  Severe depletion  Clavicle Bone Region  Severe depletion  Clavicle and Acromion Bone Region  Severe depletion  Scapular Bone Region  Severe depletion  Dorsal Hand  Moderate depletion  Patellar Region  Severe depletion  Anterior Thigh Region  Severe depletion  Posterior  Calf Region  Severe depletion  Edema (RD Assessment)  None  Hair  Reviewed  Eyes  Reviewed  Mouth  Reviewed  Skin  Reviewed  Nails  Reviewed     Diet Order:   Diet Order            Diet NPO time specified  Diet effective now             EDUCATION NEEDS:   Education needs have been addressed  Skin:   Skin Assessment: Reviewed RN Assessment(Unstageable PI to right ischium that measures 0.8 cm x 0.6 cm, Sternal wound 17 cm x 4.2 cm x 1.1 cm with VAC)  Last BM:  pta  Height:   Ht Readings from Last 1 Encounters:  08/18/18 '5\' 8"'$  (1.727 m)    Weight:   Wt Readings from Last 1 Encounters:  08/18/18 50.6 kg    Ideal Body Weight:  70 kg  BMI:  Body mass index is 16.96 kg/m.  Estimated Nutritional Needs:   Kcal:  1800-2100kcal/day   Protein:  80-90g/day   Fluid:  >1.5L/day   Koleen Distance MS, RD, LDN Pager #- [phone] Office#- [phone] After Hours Pager: [phone]

## 2018-08-18 NOTE — Consult Note (Signed)
Donald Abbott, Donald Abbott [id number] January 15, 1966 Sandi Mealy, MD  Reason for Consult: Retropharyngeal abscess Requesting Physician: Milagros Loll, MD Consulting Physician: Sandi Mealy, MD  HPI: Patient  is a 53 y.o. 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 Pt was transferred to [facility] for management. He had a long and complicated hospital course lasting well over one month: He was taken to the OR for laryngoscopy, bronchoscopy and neck I&D by ENT on 07/12/2018. On 3/1 he had further bilateral neck I&D by ENT as well as mediastinoscopy by Thoracic surgery. On 3/3 the patient had a sternotomy with debridement and I&D of the mediastinum with the wound left open.  The patient then underwent tracheostomy due to prolonged intubation on 3/6. His perioperative course was complicated by MI, atrial fibrillation, pericardial and pleural effusions, and pneumonia. He was also diagnosed with a left TVC paralysis and was noted on modified barium swallow to have laryngeal penetration and aspiration to thickened and pureed liquids. He was subsequently made NPO and a PEG tube placed. White count has been decreasing the past few days and his most recent neck C T (3/26) showed no evidence of residual neck abscess, some reactive lypmhadenopathy and improving cellulitis. Chest CT same day showed a small amount of retrosternal fluid, small pericardial effusion, and small right pleural effusion, trace left pleural effusion.   He is now transferred back to [facility], such that he can be set up for continued long-term management at home or at a facility. He has a mediastinal wound vac. He will need a total of 4wk ABx, Ertapenem + Fluconazole (cutibacterium/propionibacteria acnes + candida albicans). He has a trach collar and PEG.  He communicated by writing and did not relate any complaints, just questions regarding when he could  eat and when the trach could be removed.   Medications:  Current Facility-Administered Medications  Medication Dose Route Frequency Provider Last Rate Last Dose  . acetaminophen (TYLENOL) tablet 650 mg  650 mg Per Tube Q6H PRN Barbaraann Rondo, MD       Or  . acetaminophen (TYLENOL) suppository 650 mg  650 mg Rectal Q6H PRN Barbaraann Rondo, MD      . amiodarone (PACERONE) tablet 200 mg  200 mg Per Tube Daily Barbaraann Rondo, MD   200 mg at 08/18/18 0912  . arformoterol (BROVANA) nebulizer solution 15 mcg  15 mcg Nebulization BID Barbaraann Rondo, MD   15 mcg at 08/18/18 1949  . aspirin chewable tablet 81 mg  81 mg Per Tube Daily Barbaraann Rondo, MD   81 mg at 08/18/18 0912  . budesonide (PULMICORT) nebulizer solution 0.25 mg  0.25 mg Nebulization Daily Barbaraann Rondo, MD   0.25 mg at 08/18/18 0713  . carvedilol (COREG) tablet 3.125 mg  3.125 mg Per Tube BID WC Barbaraann Rondo, MD   3.125 mg at 08/18/18 0912  . chlorhexidine (PERIDEX) 0.12 % solution 5 mL  5 mL Mouth/Throat BID Barbaraann Rondo, MD   5 mL at 08/18/18 2107  . enoxaparin (LOVENOX) injection 40 mg  40 mg Subcutaneous Q24H Barbaraann Rondo, MD      . ertapenem [facility]) 1,000 mg in sodium chloride 0.9 % 100 mL IVPB  1 g Intravenous Q24H Milagros Loll, MD   Stopped at 08/18/18 0423  . feeding supplement (JEVITY 1.5 CAL/FIBER) liquid 1,000 mL  1,000 mL Per Tube Continuous Milagros Loll, MD 25 mL/hr at 08/18/18 1401 1,000  mL at 08/18/18 1401  . fluconazole (DIFLUCAN) 40 MG/ML suspension 400 mg  400 mg Per Tube Daily Milagros Loll, MD   400 mg at 08/18/18 0913  . folic acid (FOLVITE) tablet 1 mg  1 mg Per Tube Daily Barbaraann Rondo, MD   1 mg at 08/18/18 0912  . free water 50 mL  50 mL Per Tube Q4H Milagros Loll, MD   50 mL at 08/18/18 2108  . HYDROmorphone (DILAUDID) injection 0.5 mg  0.5 mg Intravenous Q3H PRN Barbaraann Rondo, MD       Or  . HYDROmorphone (DILAUDID) injection 1 mg  1 mg  Intravenous Q3H PRN Barbaraann Rondo, MD   1 mg at 08/18/18 2034  . ipratropium-albuterol (DUONEB) 0.5-2.5 (3) MG/3ML nebulizer solution 3 mL  3 mL Nebulization Q4H PRN Barbaraann Rondo, MD      . MEDLINE mouth rinse  15 mL Mouth Rinse BID Barbaraann Rondo, MD   15 mL at 08/18/18 2109  . [START ON 08/19/2018] Melatonin TABS 5 mg  5 mg Per Tube QHS Barbaraann Rondo, MD      . nutrition supplement (JUVEN) (JUVEN) powder packet 1 packet  1 packet Per Tube BID BM Milagros Loll, MD   1 packet at 08/18/18 1401  . OLANZapine (ZYPREXA) tablet 2.5 mg  2.5 mg Per Tube QHS PRN Barbaraann Rondo, MD      . ondansetron (ZOFRAN) injection 4 mg  4 mg Intravenous Q6H PRN Marjie Skiff, Prasanna, MD      . polyethylene glycol (MIRALAX / GLYCOLAX) packet 17 g  17 g Per Tube BID Barbaraann Rondo, MD   17 g at 08/18/18 2054  . psyllium (HYDROCIL/METAMUCIL) packet 1 packet  1 packet Per Tube BID Barbaraann Rondo, MD   1 packet at 08/18/18 2057  . senna-docusate (Senokot-S) tablet 1 tablet  1 tablet Per Tube QHS PRN Barbaraann Rondo, MD      . sodium chloride (OCEAN) 0.65 % nasal spray 1 spray  1 spray Each Nare PRN Marjie Skiff, Prasanna, MD      . sodium chloride HYPERTONIC 3 % nebulizer solution 4 mL  4 mL Nebulization BID Barbaraann Rondo, MD   4 mL at 08/18/18 1949  . thiamine (VITAMIN B-1) tablet 100 mg  100 mg Per Tube Daily Barbaraann Rondo, MD   100 mg at 08/18/18 0912  . [START ON 08/19/2018] vitamin C (ASCORBIC ACID) tablet 250 mg  250 mg Per Tube BID Milagros Loll, MD      .  Medications Prior to Admission  Medication Sig Dispense Refill  . Acetaminophen 650 MG/20.3ML SOLN 20.3 mLs by Enteral route every 4 (four) hours as needed.    Marland Kitchen albuterol (PROVENTIL) (2.5 MG/3ML) 0.083% nebulizer solution Inhale 3 mLs into the lungs every 6 (six) hours as needed for wheezing or shortness of breath.    Marland Kitchen amiodarone (PACERONE) 200 MG tablet 1 tablet (200 mg total) by G-tube route daily.    Marland Kitchen  arformoterol (BROVANA) 15 MCG/2ML NEBU Take 15 mcg by nebulization 2 (two) times daily.     Marland Kitchen aspirin 81 MG chewable tablet 1 tablet (81 mg total) by G-tube route daily.    . budesonide (PULMICORT) 0.25 MG/2ML nebulizer solution Inhale 2 mLs into the lungs daily.    . carvedilol (COREG) 3.125 MG tablet 1 tablet (3.125 mg total) by G-tube route Two (2) times a day.    . chlorhexidine (PERIDEX) 0.12 % solution 5 mL by Mouth route two (2) times a day.    Marland Kitchen  ertapenem 500 mg in sodium chloride 0.9 % 50 mL Inject into the vein. Infuse 1 gram into a venous catheter daily for 8 days    . fluconazole (DIFLUCAN) 200 MG tablet 2 tablets (400 mg total) by G-tube route daily for 8 days.    . hydrALAZINE (APRESOLINE) 20 MG/ML injection Inject 1 mL into the vein every 4 (four) hours as needed. (Give for SBP>160 after Labetalol)    . labetalol (NORMODYNE,TRANDATE) 5 MG/ML injection Inject 4 mLs into the vein every 4 (four) hours as needed. (Give for SBP>160 or HR>100)    . loperamide (IMODIUM) 2 MG capsule Take 1 capsule by mouth 2 (two) times daily as needed.    . Melatonin 3 MG TABS 2 tablets (6 mg total) by Enteral tube: gastric  route every evening.    . OLANZapine (ZYPREXA) 2.5 MG tablet 1 tablet (2.5 mg total) by G-tube rouMarland Kitchente nightly as needed.    . ondansetron (ZOFRAN) 4 MG/2ML SOLN injection Infuse 2 mL (4 mg total) into a venous catheter every eight (8) hours as needed.    Marland Kitchen. oxyCODONE (ROXICODONE) 5 MG/5ML solution 5 mL (5 mg total) by G-tube route every four (4) hours as needed for up to 5 days.    . polyethylene glycol (MIRALAX / GLYCOLAX) packet 17 g by G-tube route Two (2) times a day.    . Psyllium (METAMUCIL FIBER) 51.7 % PACK Take 1 packet by mouth 2 (two) times daily.    . sodium chloride (ALTAMIST SPRAY) 0.65 % nasal spray 1 spray by Each Nare route every six (6) hours as needed.    . sodium chloride flush 0.9 % SOLN injection Inject into the vein. Infuse 10 mL into a venous catheter every 8  hours.    . sodium chloride HYPERTONIC 3 % nebulizer solution Inhale 4 mLs into the lungs 2 (two) times daily.    Marland Kitchen. thiamine 100 MG tablet 1 tablet (100 mg total) by G-tube route daily.    Marland Kitchen. albuterol (PROVENTIL HFA;VENTOLIN HFA) 108 (90 Base) MCG/ACT inhaler Inhale 2 puffs into the lungs every 6 (six) hours as needed for wheezing or shortness of breath. (Patient not taking: Reported on 08/18/2018) 1 Inhaler 2  . budesonide-formoterol (SYMBICORT) 160-4.5 MCG/ACT inhaler Inhale 2 puffs into the lungs 2 (two) times daily.    Marland Kitchen. Spacer/Aero-Holding Chambers (AEROCHAMBER PLUS) inhaler Use as instructed (Patient not taking: Reported on 08/18/2018) 1 each 2    Allergies:  Allergies  Allergen Reactions  . Other     Dog and cat dander intermittently    PMH:  Past Medical History:  Diagnosis Date  . COPD (chronic obstructive pulmonary disease) (HCC)     Fam Hx: History reviewed. No pertinent family history.  Soc Hx:  Social History   Socioeconomic History  . Marital status: Single    Spouse name: Not on file  . Number of children: Not on file  . Years of education: Not on file  . Highest education level: Not on file  Occupational History  . Not on file  Social Needs  . Financial resource strain: Not on file  . Food insecurity:    Worry: Not on file    Inability: Not on file  . Transportation needs:    Medical: Not on file    Non-medical: Not on file  Tobacco Use  . Smoking status: Former Games developermoker  . Smokeless tobacco: Never Used  Substance and Sexual Activity  . Alcohol use: Yes  . Drug  use: Not on file  . Sexual activity: Not on file  Lifestyle  . Physical activity:    Days per week: Not on file    Minutes per session: Not on file  . Stress: Not on file  Relationships  . Social connections:    Talks on phone: Not on file    Gets together: Not on file    Attends religious service: Not on file    Active member of club or organization: Not on file    Attends meetings of  clubs or organizations: Not on file    Relationship status: Not on file  . Intimate partner violence:    Fear of current or ex partner: Not on file    Emotionally abused: Not on file    Physically abused: Not on file    Forced sexual activity: Not on file  Other Topics Concern  . Not on file  Social History Narrative  . Not on file    PSH: History reviewed. No pertinent surgical history.. Procedures since admission: No admission procedures for hospital encounter.  ROS: Review of systems normal other than 12 systems except per HPI.  PHYSICAL EXAM  Vitals: Blood pressure 116/76, pulse 80, temperature 97.9 F (36.6 C), temperature source Oral, resp. rate 19, height  (1.727 m), weight 50.6 kg, SpO2 (!) 89 %.. General: Well-developed, Well-nourished in no acute distress Mood: Mood and affect well adjusted, pleasant and cooperative. Orientation: Grossly alert and oriented. Vocal Quality: Aphonic. Communicates with writing.  head and Face: NCAT. No facial asymmetry. No visible skin lesions. No significant facial scars. No tenderness with sinus percussion. Facial strength normal and symmetric. Ears: External ears with normal landmarks, no lesions. External auditory canals free of infection, cerumen impaction or lesions. Tympanic membranes intact with good landmarks and normal mobility on pneumatic otoscopy. No middle ear effusion. Hearing: Speech reception grossly normal. Nose: External nose normal with midline dorsum and no lesions or deformity. Nasal Cavity reveals essentially midline septum with normal inferior turbinates. No significant mucosal congestion or erythema. Nasal secretions are minimal and clear. No polyps seen on anterior rhinoscopy. Oral Cavity/ Oropharynx: Lips are normal with no lesions. Teeth no frank dental caries. Gingiva healthy with no lesions or gingivitis. Oropharynx including tongue, buccal mucosa, floor of mouth, hard and soft palate, uvula and posterior pharynx  free of exudates, erythema or lesions with normal symmetry and hydration.  Indirect Laryngoscopy/Nasopharyngoscopy: Visualization of the larynx, hypopharynx and nasopharynx is not possible in this setting with routine examination. Neck: Supple and symmetric with no palpable masses, minimal tenderness and no crepitance. Healing wounds without discharge on both sides of the neck have scabbed over and healing by secondary intention. There is a #6 Shiley uncuffed trach tube in place.  Thyroid gland cannot be palpated due to the tube. Parotid and submandibular glands are soft, nontender and symmetric, without masses. Lymphatic: Cervical lymph nodes are without palpable lymphadenopathy. Respiratory: Normal respiratory effort without labored breathing. Cardiovascular: Carotid pulse shows regular rate and rhythm Neurologic: Cranial Nerves II through XII are grossly intact, though known to have left recurrent laryngeal nerve deficit Eyes: Gaze and Ocular Motility are grossly normal. PERRLA. No visible nystagmus.  MEDICAL DECISION MAKING: Data Review:  Results for orders placed or performed during the hospital encounter of 08/17/18 (from the past 48 hour(s))  CBC with Differential/Platelet     Status: Abnormal   Collection Time: 08/18/18  2:37 AM  Result Value Ref Range   WBC 10.9 (H) 4.0 -  10.5 K/uL   RBC 3.56 (L) 4.22 - 5.81 MIL/uL   Hemoglobin 10.1 (L) 13.0 - 17.0 g/dL   HCT 96.0 (L) 45.4 - 09.8 %   MCV 89.3 80.0 - 100.0 fL   MCH 28.4 26.0 - 34.0 pg   MCHC 31.8 30.0 - 36.0 g/dL   RDW 11.9 (H) 14.7 - 82.9 %   Platelets 365 150 - 400 K/uL   nRBC 0.0 0.0 - 0.2 %   Neutrophils Relative % 68 %   Neutro Abs 7.6 1.7 - 7.7 K/uL   Lymphocytes Relative 17 %   Lymphs Abs 1.8 0.7 - 4.0 K/uL   Monocytes Relative 8 %   Monocytes Absolute 0.9 0.1 - 1.0 K/uL   Eosinophils Relative 5 %   Eosinophils Absolute 0.5 0.0 - 0.5 K/uL   Basophils Relative 1 %   Basophils Absolute 0.1 0.0 - 0.1 K/uL   Immature  Granulocytes 1 %   Abs Immature Granulocytes 0.05 0.00 - 0.07 K/uL    Comment: Performed at [facility], [address] [place], [place] [address]  Comprehensive metabolic panel     Status: Abnormal   Collection Time: 08/18/18  2:37 AM  Result Value Ref Range   Sodium 137 135 - 145 mmol/L   Potassium 3.8 3.5 - 5.1 mmol/L   Chloride 96 (L) 98 - 111 mmol/L   CO2 32 22 - 32 mmol/L   Glucose, Bld 117 (H) 70 - 99 mg/dL   BUN 11 6 - 20 mg/dL   Creatinine, Ser 0.86 (L) 0.61 - 1.24 mg/dL   Calcium 8.7 (L) 8.9 - 10.3 mg/dL   Total Protein 6.7 6.5 - 8.1 g/dL   Albumin 3.0 (L) 3.5 - 5.0 g/dL   AST 52 (H) 15 - 41 U/L   ALT 68 (H) 0 - 44 U/L   Alkaline Phosphatase 67 38 - 126 U/L   Total Bilirubin 0.5 0.3 - 1.2 mg/dL   GFR calc non Af Amer >60 >60 mL/min   GFR calc Af Amer >60 >60 mL/min   Anion gap 9 5 - 15    Comment: Performed at [facility], [address] [place], [place] [address]  Magnesium     Status: None   Collection Time: 08/18/18  2:37 AM  Result Value Ref Range   Magnesium 1.7 1.7 - 2.4 mg/dL    Comment: Performed at [facility], [address], [place], [place] [address]  Phosphorus     Status: None   Collection Time: 08/18/18  2:37 AM  Result Value Ref Range   Phosphorus 4.2 2.5 - 4.6 mg/dL    Comment: Performed at [facility], [address] [place], [place] [address]  Procalcitonin - Baseline     Status: None   Collection Time: 08/18/18  2:37 AM  Result Value Ref Range   Procalcitonin <0.10 ng/mL    Comment:        Interpretation: PCT (Procalcitonin) <= 0.5 ng/mL: Systemic infection (sepsis) is not likely. Local bacterial infection is possible. (NOTE)       Sepsis PCT Algorithm           Lower Respiratory Tract                                      Infection PCT Algorithm    ----------------------------     ----------------------------  PCT < 0.25 ng/mL                PCT < 0.10 ng/mL         Strongly encourage              Strongly discourage   discontinuation of antibiotics    initiation of antibiotics    ----------------------------     -----------------------------       PCT 0.25 - 0.50 ng/mL            PCT 0.10 - 0.25 ng/mL               OR       >80% decrease in PCT            Discourage initiation of                                            antibiotics      Encourage discontinuation           of antibiotics    ----------------------------     -----------------------------         PCT >= 0.50 ng/mL              PCT 0.26 - 0.50 ng/mL               AND        <80% decrease in PCT             Encourage initiation of                                             antibiotics       Encourage continuation           of antibiotics    ----------------------------     -----------------------------        PCT >= 0.50 ng/mL                  PCT > 0.50 ng/mL               AND         increase in PCT                  Strongly encourage                                      initiation of antibiotics    Strongly encourage escalation           of antibiotics                                     -----------------------------                                           PCT <= 0.25 ng/mL  OR                                        > 80% decrease in PCT                                     Discontinue / Do not initiate                                             antibiotics Performed at [facility], [address] [place], [place] [address]   Lactic acid, plasma     Status: None   Collection Time: 08/18/18  2:43 AM  Result Value Ref Range   Lactic Acid, Venous 0.9 0.5 - 1.9 mmol/L    Comment: Performed at [facility], [address] [place], [place] [address]  Troponin I - Once     Status: None   Collection Time: 08/18/18  2:43 AM  Result Value Ref Range   Troponin I <0.03 <0.03 ng/mL    Comment: Performed at [facility],  [address], [place], [place] [address]  . Dg Chest Port 1 View  Result Date: 08/18/2018 CLINICAL DATA:  53 year old male with history of retropharyngeal abscess and mediastinum 8 S last month. EXAM: PORTABLE CHEST 1 VIEW COMPARISON:  Chest CT 07/12/2018 and earlier. FINDINGS: Portable AP upright view at 0051 hours. New tracheostomy since February projects over the tracheal air column. New sternotomy. Right upper extremity approach PICC line in place. Small volume right pleural effusion is new. Superimposed streaky right lung base opacity. No pneumothorax identified. Increased pulmonary vascularity without overt edema. Mediastinal contours remain within normal limits. Negative visible bowel gas pattern. No acute osseous abnormality identified. New right lower neck surgical clips. IMPRESSION: 1. Sequelae of sternotomy and tracheostomy tube placed since February with no adverse features. Right upper extremity approach PICC line in place. 2. Small volume right pleural effusion is new since February. Superimposed streaky right lung base opacity is nonspecific and could reflect atelectasis or infection. Electronically Signed   By: Odessa Fleming M.D.   On: 08/18/2018 01:05  .   ASSESSMENT/PLAN: Patient who is status post I&D of neck and mediastinal abscesses, as well as tracheostomy (3/6). There is no evidence of further neck abscess at this point based on recent neck CT. Defer to Thoracic surgery on any need to re-assess the small retrosternal fluid collection seen on the chest CT. Would continue the course of medical treatment suggested at [facility].  The tracheostomy is 3 weeks out. I would recommend keeping the tracheostomy for now until his pulmonary status is considered stable and he has had a further swallowing test. If he is cleared by speech to resume a PO diet and tolerates this with no further aspiration and pulmonary decompensation, he could eventually be decannulated. Trials of capping the trach can be  considered during this hospitalization if supplemental O2 is not needed. I would ultimately leave the decision to decannulate up to ENT at [facility]. We are trying to minimize use of fiberoptic laryngoscopy due to COVID-19 precautions, and there is no urgent need to reassess his airway. They will be doing that as they follow up on his  cord paralysis.   Note that when we were consulted there was no mention of ENT changing the trach, but now I see a mention of this in the notes. This is a 3 week out trach and has already been changed out from a cuffed trach. Respiratory therapy is cleared to perform trach changes at this point.  In terms of the left vocal cord paralysis, this could be secondary to inflammation, a result of traction on the nerve during his mediastinal debridement, or possibly even complete section of the nerve during these procedures. The patient has follow up scheduled at [facility] with Dr. Roma Schanz on 4/15 and Dr. Sherryll Burger on 4/21. Their laryngologist may be able to provide the patient with some prognostic advice at some point by performing a laryngeal EMG. Recovery of an injured nerve can take months. If the nerve is out permanently they could discuss medialization procedures.  Will monitor patient progress through EPIC, but he appears stable from an ENT standpoint with no further need for surgical intervention. Feel free to contact me or reconsult for any new concerns.  Sandi Mealy, MD 08/18/2018 10:33 PM

## 2018-08-18 NOTE — Progress Notes (Signed)
Patient is a transfer from [facility] currently with #6 shiley trach on 28% aerosol o2.  BBS diminished with some rhonci. Suctioned trach x2 for small amount of thick cream colored secretions. Patient tolerated well. MD has been alerted that patient has arrived to floor by RN.

## 2018-08-18 NOTE — Progress Notes (Addendum)
[employer] - [place] [facility]   PATIENT NAME: Donald Abbott    MR#:  [id number]  DATE OF BIRTH:  11-Sep-1965  SUBJECTIVE:  CHIEF COMPLAINT:  No chief complaint on file.  Patient transferred from [facility] after 6 week stay there. Patient feels weak.  Wants to eat but is n.p.o.  REVIEW OF SYSTEMS:    Review of Systems  Constitutional: Positive for malaise/fatigue. Negative for chills and fever.  HENT: Negative for sore throat.   Eyes: Negative for blurred vision, double vision and pain.  Respiratory: Negative for cough, hemoptysis, shortness of breath and wheezing.   Cardiovascular: Negative for chest pain, palpitations, orthopnea and leg swelling.  Gastrointestinal: Positive for abdominal pain. Negative for constipation, diarrhea, heartburn, nausea and vomiting.  Genitourinary: Negative for dysuria and hematuria.  Musculoskeletal: Negative for back pain and joint pain.  Skin: Negative for rash.  Neurological: Negative for sensory change, speech change, focal weakness and headaches.  Endo/Heme/Allergies: Does not bruise/bleed easily.  Psychiatric/Behavioral: Negative for depression. The patient is not nervous/anxious.     DRUG ALLERGIES:   Allergies  Allergen Reactions  . Other     Dog and cat dander intermittently    VITALS:  Blood pressure (!) 95/53, pulse 69, temperature 98.8 F (37.1 C), resp. rate 19, height 5\' 8"  (1.727 m), weight 50.6 kg, SpO2 98 %.  PHYSICAL EXAMINATION:   Physical Exam  GENERAL:  53 y.o.-year-old patient lying in the bed with no acute distress.  EYES: Pupils equal, round, reactive to light and accommodation. No scleral icterus. Extraocular muscles intact.  HEENT: Head atraumatic, normocephalic. Oropharynx and nasopharynx clear.  Tracheostomy in place NECK:  Supple, no jugular venous distention. No thyroid enlargement, no tenderness.  LUNGS: Normal breath sounds bilaterally, no wheezing, rales, rhonchi. No use of accessory  muscles of respiration.  Sternal wound with wound VAC in place CARDIOVASCULAR: S1, S2 normal. No murmurs, rubs, or gallops.  ABDOMEN: Soft, nontender, nondistended. Bowel sounds present. No organomegaly or mass.  PEG tube in place EXTREMITIES: No cyanosis, clubbing or edema b/l.    NEUROLOGIC: Cranial nerves II through XII are intact. No focal Motor or sensory deficits b/l.   PSYCHIATRIC: The patient is alert and oriented x 3.  SKIN: No obvious rash, lesion, or ulcer.   LABORATORY PANEL:   CBC Recent Labs  Lab 08/18/18 0237  WBC 10.9*  HGB 10.1*  HCT 31.8*  PLT 365   ------------------------------------------------------------------------------------------------------------------ Chemistries  Recent Labs  Lab 08/18/18 0237  NA 137  K 3.8  CL 96*  CO2 32  GLUCOSE 117*  BUN 11  CREATININE 0.51*  CALCIUM 8.7*  MG 1.7  AST 52*  ALT 68*  ALKPHOS 67  BILITOT 0.5   ------------------------------------------------------------------------------------------------------------------  Cardiac Enzymes Recent Labs  Lab 08/18/18 0243  TROPONINI <0.03   ------------------------------------------------------------------------------------------------------------------  RADIOLOGY:  Dg Chest Port 1 View  Result Date: 08/18/2018 CLINICAL DATA:  53 year old male with history of retropharyngeal abscess and mediastinum 8 S last month. EXAM: PORTABLE CHEST 1 VIEW COMPARISON:  Chest CT 07/12/2018 and earlier. FINDINGS: Portable AP upright view at 0051 hours. New tracheostomy since February projects over the tracheal air column. New sternotomy. Right upper extremity approach PICC line in place. Small volume right pleural effusion is new. Superimposed streaky right lung base opacity. No pneumothorax identified. Increased pulmonary vascularity without overt edema. Mediastinal contours remain within normal limits. Negative visible bowel gas pattern. No acute osseous abnormality identified. New  right lower neck surgical clips. IMPRESSION: 1.  Sequelae of sternotomy and tracheostomy tube placed since February with no adverse features. Right upper extremity approach PICC line in place. 2. Small volume right pleural effusion is new since February. Superimposed streaky right lung base opacity is nonspecific and could reflect atelectasis or infection. Electronically Signed   By: Odessa Fleming M.D.   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  Status post chest tubes and now removed. *COPD  Discussed with Dr. Lawanda Cousins of infectious disease at [facility].  Recommended to continue IV ertapenem and fluconazole till 09/01/2018 and reimage the chest.  They are sharing CT chest images through powershare Thoracic surgery consulted for his sternal wound Restarted tube feeds ENT consult for trach changes  Discussed with social work regarding placement  All the records are reviewed and case discussed with Care Management/Social Worker Management plans discussed with the patient, family and they are in agreement.  CODE STATUS: Full code  DVT Prophylaxis: SCDs  TOTAL TIME TAKING CARE OF THIS PATIENT: 35 minutes.   POSSIBLE D/C IN 3-4 DAYS, DEPENDING ON CLINICAL CONDITION.  Molinda Bailiff Rashi Giuliani M.D on 08/18/2018 at 2:11 PM  Between 7am to 6pm - Pager - [phone]  After 6pm go to [email or web address] - password [email or web address]  [facility]  Office  [phone]  CC: Primary care physician; Patient, No Pcp Per  Note: This dictation was prepared with Dragon dictation along with smaller phrase technology. Any transcriptional errors that result from this  process are unintentional.

## 2018-08-18 NOTE — Consult Note (Signed)
NAME: Donald Abbott  DOB: 19-Jan-1966  MRN: [id number]  Date/Time: 08/18/2018 3:36 PM  REQUESTING PROVIDER: Allena Katz Subjective:  REASON FOR CONSULT: pt with retropharyngeal abscess, mediastinitis, b/l empyema- follow up  53 y.o. male with history COPD, substance abuse, and prior MRSA neck infection who presented to [facility] ED on 2/19 with difficulty breathing, swelling of throat and sore throat. He had previously treated for influenza. On 2/19 he was diagnosed  with retropharyngeal abscess with mediastinal extension , and was transferred to [facility]. He  had an extensive hospital course at [facility]  to include emergent intubation and drainage in OR on 07/12/18, as well as multiple neck wash-outs with thoracic on 3/1 and 3/3. tracheostomy placement and neck closure on 07/31/18. On 08/05/18 2 d echo showed EF 55-60% and  large pericardial effusion. Chest tubes removed on 08/07/18. RT UE PICC placed 08/11/18 Course c/b bilateral vocal cord paralysis and persistent dysphagia requiring long term enteral access. No prior abdominal surgeries.  s/p Laparoscopic gastrostomy placement 08/15/18. Repeat echo no pericardial effusion. Was sent back to [facility] on 08/17/18 Seen by ID at [facility] Cultures  2/21 Pleural fluid streptococcus anginosus 2/28 neck tissue cutibacterium, neck swab cx cutibacterium, mediastinal fat cx cutibacterium( propionibacterium)  Previous antimicrobials: Vancomycin (2/19-3/4) Zosyn (2/19-2/21) Cefepime (2/21- 3/4) Flagyl (2/21-3/2) Micafungin (2/27- 3/7) Clindamycin (3/2-3/4)   He is on Ertapenem and fluconazole to be given until 08/26/18   Past Medical History:  Diagnosis Date  . COPD (chronic obstructive pulmonary disease) (HCC)     Surgical History None before 07/12/18 which is listed above Social History   Socioeconomic History  . Marital status: Single    Spouse name: Not on file  . Number of children: Not on file  . Years of education: Not on file  . Highest education level: Not on file   Occupational History  . Not on file  Social Needs  . Financial resource strain: Not on file  . Food insecurity:    Worry: Not on file    Inability: Not on file  . Transportation needs:    Medical: Not on file    Non-medical: Not on file  Tobacco Use  . Smoking status: Former Games developer  . Smokeless tobacco: Never Used  Substance and Sexual Activity  . Alcohol use: Yes  . Drug use: Not on file  . Sexual activity: Not on file  Lifestyle  . Physical activity:    Days per week: Not on file    Minutes per session: Not on file  . Stress: Not on file  Relationships  . Social connections:    Talks on phone: Not on file    Gets together: Not on file    Attends religious service: Not on file    Active member of club or organization: Not on file    Attends meetings of clubs or organizations: Not on file    Relationship status: Not on file  . Intimate partner violence:    Fear of current or ex partner: Not on file    Emotionally abused: Not on file    Physically abused: Not on file    Forced sexual activity: Not on file  Other Topics Concern  . Not on file  Social History Narrative  . Not on file    History reviewed. No pertinent family history. Allergies  Allergen Reactions  . Other     Dog and cat dander intermittently   ? Current Facility-Administered Medications  Medication Dose Route Frequency Provider Last Rate  Last Dose  . acetaminophen (TYLENOL) tablet 650 mg  650 mg Per Tube Q6H PRN Barbaraann Rondo, MD       Or  . acetaminophen (TYLENOL) suppository 650 mg  650 mg Rectal Q6H PRN Barbaraann Rondo, MD      . amiodarone (PACERONE) tablet 200 mg  200 mg Per Tube Daily Barbaraann Rondo, MD   200 mg at 08/18/18 0912  . arformoterol (BROVANA) nebulizer solution 15 mcg  15 mcg Nebulization BID Barbaraann Rondo, MD   15 mcg at 08/18/18 0713  . aspirin chewable tablet 81 mg  81 mg Per Tube Daily Barbaraann Rondo, MD   81 mg at 08/18/18 0912  . budesonide  (PULMICORT) nebulizer solution 0.25 mg  0.25 mg Nebulization Daily Barbaraann Rondo, MD   0.25 mg at 08/18/18 0713  . carvedilol (COREG) tablet 3.125 mg  3.125 mg Per Tube BID WC Barbaraann Rondo, MD   3.125 mg at 08/18/18 0912  . chlorhexidine (PERIDEX) 0.12 % solution 5 mL  5 mL Mouth/Throat BID Barbaraann Rondo, MD   5 mL at 08/18/18 0912  . enoxaparin (LOVENOX) injection 40 mg  40 mg Subcutaneous Q24H Barbaraann Rondo, MD      . ertapenem [facility]) 1,000 mg in sodium chloride 0.9 % 100 mL IVPB  1 g Intravenous Q24H Milagros Loll, MD   Stopped at 08/18/18 0423  . feeding supplement (JEVITY 1.5 CAL/FIBER) liquid 1,000 mL  1,000 mL Per Tube Continuous Milagros Loll, MD 25 mL/hr at 08/18/18 1401 1,000 mL at 08/18/18 1401  . fluconazole (DIFLUCAN) 40 MG/ML suspension 400 mg  400 mg Per Tube Daily Milagros Loll, MD   400 mg at 08/18/18 0913  . folic acid (FOLVITE) tablet 1 mg  1 mg Per Tube Daily Barbaraann Rondo, MD   1 mg at 08/18/18 0912  . free water 50 mL  50 mL Per Tube Q4H Milagros Loll, MD   50 mL at 08/18/18 1414  . HYDROmorphone (DILAUDID) injection 0.5 mg  0.5 mg Intravenous Q3H PRN Barbaraann Rondo, MD       Or  . HYDROmorphone (DILAUDID) injection 1 mg  1 mg Intravenous Q3H PRN Barbaraann Rondo, MD   1 mg at 08/18/18 1130  . ipratropium-albuterol (DUONEB) 0.5-2.5 (3) MG/3ML nebulizer solution 3 mL  3 mL Nebulization Q4H PRN Barbaraann Rondo, MD      . MEDLINE mouth rinse  15 mL Mouth Rinse BID Barbaraann Rondo, MD   15 mL at 08/18/18 0915  . [START ON 08/19/2018] Melatonin TABS 5 mg  5 mg Per Tube QHS Barbaraann Rondo, MD      . nutrition supplement (JUVEN) (JUVEN) powder packet 1 packet  1 packet Per Tube BID BM Milagros Loll, MD   1 packet at 08/18/18 1401  . OLANZapine (ZYPREXA) tablet 2.5 mg  2.5 mg Per Tube QHS PRN Barbaraann Rondo, MD      . ondansetron (ZOFRAN) injection 4 mg  4 mg Intravenous Q6H PRN Marjie Skiff, Prasanna, MD      . polyethylene  glycol (MIRALAX / GLYCOLAX) packet 17 g  17 g Per Tube BID Barbaraann Rondo, MD   17 g at 08/18/18 0914  . psyllium (HYDROCIL/METAMUCIL) packet 1 packet  1 packet Per Tube BID Barbaraann Rondo, MD   1 packet at 08/18/18 0913  . senna-docusate (Senokot-S) tablet 1 tablet  1 tablet Per Tube QHS PRN Barbaraann Rondo, MD      . sodium chloride (OCEAN) 0.65 % nasal spray 1 spray  1 spray  Each Nare PRN Barbaraann Rondo, MD      . sodium chloride HYPERTONIC 3 % nebulizer solution 4 mL  4 mL Nebulization BID Barbaraann Rondo, MD   4 mL at 08/18/18 0908  . thiamine (VITAMIN B-1) tablet 100 mg  100 mg Per Tube Daily Barbaraann Rondo, MD   100 mg at 08/18/18 0912  . [START ON 08/19/2018] vitamin C (ASCORBIC ACID) tablet 250 mg  250 mg Per Tube BID Milagros Loll, MD         Abtx:  Anti-infectives (From admission, onward)   Start     Dose/Rate Route Frequency Ordered Stop   08/18/18 1000  fluconazole (DIFLUCAN) 40 MG/ML suspension 400 mg     400 mg Per Tube Daily 08/18/18 0119 08/26/18 2359   08/18/18 0130  ertapenem (INVANZ) 1,000 mg in sodium chloride 0.9 % 100 mL IVPB     1 g 200 mL/hr over 30 Minutes Intravenous Every 24 hours 08/18/18 0119 08/26/18 2359      REVIEW OF SYSTEMS:  Says he hads no fever, some chest pain at the site of wound vac, had bowel movt, no cough No headache or dizzienss Objective:  VITALS:  BP (!) 95/53 (BP Location: Left Arm)   Pulse 69   Temp 98.8 F (37.1 C)   Resp 19   Ht  (1.727 m)   Wt 50.6 kg   SpO2 98%   BMI 16.96 kg/m  PHYSICAL EXAM:  General: Alert, cooperative, no distress, appears stated age. thin Head: Normocephalic, without obvious abnormality, atraumatic. Eyes: Conjunctivae clear, anicteric sclerae. Pupils are equal ENT Nares normal. No drainage or sinus tenderness. Lips, mucosa, and tongue normal. No Thrush Neck: tracheostomy Back: No CVA tenderness. Lungs: b/l air entry-  Heart:s1s2 Sternal wound covered with wound  vac Site of previous chest drains- 3 -scab present Abdomen: Soft, Peg Extremities: atraumatic, no cyanosis. No edema. No clubbing Rt PICc Skin: No rashes or lesions. Or bruising Lymph: Cervical, supraclavicular normal. Neurologic: Grossly non-focal Pertinent Labs Lab Results CBC    Component Value Date/Time   WBC 10.9 (H) 08/18/2018 0237   RBC 3.56 (L) 08/18/2018 0237   HGB 10.1 (L) 08/18/2018 0237   HCT 31.8 (L) 08/18/2018 0237   PLT 365 08/18/2018 0237   MCV 89.3 08/18/2018 0237   MCH 28.4 08/18/2018 0237   MCHC 31.8 08/18/2018 0237   RDW 15.9 (H) 08/18/2018 0237   LYMPHSABS 1.8 08/18/2018 0237   MONOABS 0.9 08/18/2018 0237   EOSABS 0.5 08/18/2018 0237   BASOSABS 0.1 08/18/2018 0237    CMP Latest Ref Rng & Units 08/18/2018 07/12/2018 11/15/2016  Glucose 70 - 99 mg/dL 213(Y) 865(H) 846(N)  BUN 6 - 20 mg/dL 11 62(X) 18  Creatinine 0.61 - 1.24 mg/dL 5.28(U) 1.32(G) 4.01  Sodium 135 - 145 mmol/L 137 135 139  Potassium 3.5 - 5.1 mmol/L 3.8 3.9 4.0  Chloride 98 - 111 mmol/L 96(L) 103 104  CO2 22 - 32 mmol/L 32 21(L) 28  Calcium 8.9 - 10.3 mg/dL 0.2(V) 9.5 9.4  Total Protein 6.5 - 8.1 g/dL 6.7 7.7 7.1  Total Bilirubin 0.3 - 1.2 mg/dL 0.5 2.5(D) 0.5  Alkaline Phos 38 - 126 U/L 67 61 44  AST 15 - 41 U/L 52(H) 28 29  ALT 0 - 44 U/L 68(H) 16 25      Microbiology:see [facility] records above  IMAGING RESULTS: none ? Impression/Recommendation ?53 yr male with retropharyngeal, parapharyngeal abscess, mediastinal extensions/p surgery, b/l empyema,s/p drainage now has trachesotomy  and PEG On ertapenem and fluconazole for 4 weeks until 08/26/18. Cultures from [facility] had propionibacterium, candida, strep anginosus Would  change ertapenem to zosyn  ?Sternal wound vac  Tracheostomy  PEG ___________________________________________________ Discussed with patient

## 2018-08-18 NOTE — Progress Notes (Signed)
SLP Cancellation Note  Patient Details Name: Donald Abbott MRN: [id number] DOB: 04-Oct-1965   Cancelled treatment:       Reason Eval/Treat Not Completed: Patient not medically ready;Medical issues which prohibited therapy(chart reviewed; consulted pt re: status, need for MBSS). Extensive chart reviewed performed; pt was admitted at [facility] for ~6 weeks. Pt is a 53 y/o male w/ PMHx COPD, substance abuse and prior MRSA neck infection. Pt was a transfer from [facility] to [facility] 07/12/2018 for retropharyngeal abscess w/ mediastinal extension/abscess + mediastinitis), return transfer from [facility] to [facility] 08/17/2018 for continued long-term management. A MBSS was performed on 08/10/2018 which indicated aspiration of all consistencies. Pt has a PEG placement for nutritional needs; tracheostomy for airway management; Left vocal cord paralysis per ENT consult.  Pt is alert and communicating via writing and texting. Informed pt, and MD, that a MBSS could be planned for Mon/Tues to reassess pt's swallow function to determine degree of dysphagia and safety w/ oral intake. Pt agreed.  Encouraged frequent oral care using swabs for hygiene and stimulation of swallow. ST services will f/u on Monday. NSG/MD updated, agreed.       [name], MS, CCC-SLP Watson,Katherine 08/18/2018, 2:33 PM

## 2018-08-18 NOTE — H&P (Signed)
[employer] - [place] at [facility]   PATIENT NAME: Donald Abbott    MR#:  [id number]  DATE OF BIRTH:  1965-07-08  DATE OF ADMISSION:  08/17/2018  PRIMARY CARE PHYSICIAN: Patient, No Pcp Per   REQUESTING/REFERRING PHYSICIAN: N/A  CHIEF COMPLAINT:  No chief complaint on file.   HISTORY OF PRESENT ILLNESS:  Donald Abbott  is a 53 y.o. male with a known history of COPD (no O2) presenting for direct admission as a transfer from [facility]. 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 Pt was transferred to tertiary care center [facility]). He had a long and complicated hospital course lasting well over one month: -Intubated, OR for I&D, 07/12/2018 -Periop NSTEMI (peak Trop 14) 07/14/2018 -Periop AFib -Multiple neck washouts (03/01, 03/03) -07/28/2018 Echo w/ EF 45-50%, grade II diastolic dysfxn, hypokinesis -Prolonged intubation, tracheostomy + neck closure 07/31/2018 -08/05/2018 Echo w/ EF 55-60%, large pericardial effusion (w/o tamponade) -Chest tubes removed 08/07/2018 -Failed MBSS 08/10/2018 -RUE PICC 08/11/2018 -PEG 08/15/2018 -08/15/2018 Echo w/ EF 45-50%, (-) pericardial effusion  He is now transferred back to [facility], such that he can be set up for continued long-term management at home or at a facility. He has a mediastinal wound vac. He will need a total of 4wk ABx, Ertapenem + Fluconazole (cutibacterium/propionibacteria acnes + candida albicans). He has a trach collar and PEG.  Pt has vocal cord paralysis (seen on 08/11/2018 scope), and communicates by writing. Direct Hx and ROS limited. Endorses pain at PEG site, otherwise w/o complaint. Appears thin, ill/miserable but non-toxic.  PAST MEDICAL HISTORY:   Past Medical History:  Diagnosis Date   COPD (chronic obstructive pulmonary disease) (HCC)     PAST SURGICAL HISTORY:  History reviewed. No pertinent surgical history.  SOCIAL HISTORY:    Social History   Tobacco Use   Smoking status: Former Smoker   Smokeless tobacco: Never Used  Substance Use Topics   Alcohol use: Yes    FAMILY HISTORY:  History reviewed. No pertinent family history.  DRUG ALLERGIES:   Allergies  Allergen Reactions   Other     Dog and cat dander intermittently    REVIEW OF SYSTEMS:   Review of Systems  Unable to perform ROS: Patient nonverbal  Constitutional: Positive for malaise/fatigue.  Gastrointestinal: Positive for abdominal pain.  Neurological: Positive for weakness.   MEDICATIONS AT HOME:   Prior to Admission medications   Medication Sig Start Date End Date Taking? Authorizing Provider  Acetaminophen 650 MG/20.3ML SOLN 20.3 mLs by Enteral route every 4 (four) hours as needed. 08/17/18  Yes [provider]  albuterol (PROVENTIL) (2.5 MG/3ML) 0.083% nebulizer solution Inhale 3 mLs into the lungs every 6 (six) hours as needed for wheezing or shortness of breath. 08/17/18 08/17/19 Yes [provider]  amiodarone (PACERONE) 200 MG tablet 1 tablet (200 mg total) by G-tube route daily. 08/18/18 11/16/18 Yes [provider]  arformoterol (BROVANA) 15 MCG/2ML NEBU Take 15 mcg by nebulization 2 (two) times daily.  08/17/18  Yes [provider]  aspirin 81 MG chewable tablet 1 tablet (81 mg total) by G-tube route daily. 08/18/18 09/17/18 Yes [provider]  budesonide (PULMICORT) 0.25 MG/2ML nebulizer solution Inhale 2 mLs into the lungs daily. 08/18/18 09/02/18 Yes [provider]  carvedilol (COREG) 3.125 MG tablet 1 tablet (3.125 mg total) by G-tube route Two (2) times a day. 08/17/18 09/16/18 Yes [provider]  chlorhexidine (PERIDEX) 0.12 %  solution 5 mL by Mouth route two (2) times a day. 08/17/18  Yes [provider]  ertapenem 500 mg in sodium chloride 0.9 % 50 mL Inject into the vein. Infuse 1 gram into a venous catheter daily for 8 days 08/18/18 08/26/18 Yes [provider]  fluconazole (DIFLUCAN) 200 MG tablet 2 tablets (400 mg total) by G-tube route daily for 8 days. 08/18/18 08/26/18 Yes [provider]  hydrALAZINE (APRESOLINE) 20 MG/ML injection Inject 1 mL into the vein every 4 (four) hours as needed. (Give for SBP>160 after Labetalol) 08/17/18 09/16/18 Yes [provider]  labetalol (NORMODYNE,TRANDATE) 5 MG/ML injection Inject 4 mLs into the vein every 4 (four) hours as needed. (Give for SBP>160 or HR>100) 08/17/18 09/16/18 Yes [provider]  loperamide (IMODIUM) 2 MG capsule Take 1 capsule by mouth 2 (two) times daily as needed. 08/17/18 09/16/18 Yes [provider]  Melatonin 3 MG TABS 2 tablets (6 mg total) by Enteral tube: gastric  route every evening. 08/17/18  Yes [provider]  OLANZapine (ZYPREXA) 2.5 MG tablet 1 tablet (2.5 mg total) by G-tube route nightly as needed. 08/17/18 09/16/18 Yes [provider]  ondansetron (ZOFRAN) 4 MG/2ML SOLN injection Infuse 2 mL (4 mg total) into a venous catheter every eight (8) hours as needed. 08/17/18 09/16/18 Yes [provider]  oxyCODONE (ROXICODONE) 5 MG/5ML solution 5 mL (5 mg total) by G-tube route every four (4) hours as needed for up to 5 days. 08/17/18 08/22/18 Yes [provider]  polyethylene glycol (MIRALAX / GLYCOLAX) packet 17 g by G-tube route Two (2) times a day. 08/17/18 09/16/18 Yes [provider]  Psyllium (METAMUCIL FIBER) 51.7 % PACK Take 1 packet by mouth 2 (two) times daily. 08/17/18 09/16/18 Yes [provider]  sodium chloride (ALTAMIST SPRAY) 0.65 % nasal spray 1 spray by Each Nare route every six (6) hours as needed. 08/17/18 08/17/19 Yes [provider]  sodium chloride flush 0.9 % SOLN injection Inject into the vein. Infuse 10 mL into a venous catheter every 8 hours. 08/18/18  Yes [provider]  sodium chloride HYPERTONIC 3 % nebulizer solution Inhale 4 mLs into the lungs 2 (two) times  daily. 08/17/18 09/16/18 Yes [provider]  thiamine 100 MG tablet 1 tablet (100 mg total) by G-tube route daily. 08/18/18 09/17/18 Yes [provider]  albuterol (PROVENTIL HFA;VENTOLIN HFA) 108 (90 Base) MCG/ACT inhaler Inhale 2 puffs into the lungs every 6 (six) hours as needed for wheezing or shortness of breath. Patient not taking: Reported on 08/18/2018 11/15/16   Sharyn Creamer, MD  budesonide-formoterol [facility]) 160-4.5 MCG/ACT inhaler Inhale 2 puffs into the lungs 2 (two) times daily.    [provider]  Spacer/Aero-Holding Chambers (AEROCHAMBER PLUS) inhaler Use as instructed Patient not taking: Reported on 08/18/2018 11/15/16   Sharyn Creamer, MD      VITAL SIGNS:  Blood pressure 102/75, pulse 73, temperature 97.9 F (36.6 C), temperature source Oral, resp. rate 19, height  (1.727 m), weight 50.6 kg, SpO2 98 %.  PHYSICAL EXAMINATION:  Physical Exam Constitutional:      General: He is awake. He is not in acute distress.    Appearance: He is cachectic. He is ill-appearing. He is not toxic-appearing or diaphoretic.     Interventions: He is not intubated. HENT:     Head: Atraumatic.     Mouth/Throat:     Pharynx: Oropharynx is clear.  Eyes:     General: No  scleral icterus.    Extraocular Movements: Extraocular movements intact.     Conjunctiva/sclera: Conjunctivae normal.  Neck:     Musculoskeletal: Neck supple.  Cardiovascular:     Rate and Rhythm: Normal rate and regular rhythm.  No extrasystoles are present.    Heart sounds: Normal heart sounds, S1 normal and S2 normal. No murmur. No friction rub. No gallop. No S3 or S4 sounds.   Pulmonary:     Effort: No tachypnea, bradypnea, accessory muscle usage, respiratory distress or retractions. He is not intubated.     Breath sounds: No stridor or decreased air movement. Examination of the right-upper field reveals rhonchi. Examination of the left-upper field reveals rhonchi. Examination of the right-middle  field reveals rhonchi. Examination of the left-middle field reveals rhonchi. Examination of the right-lower field reveals rhonchi. Examination of the left-lower field reveals rhonchi. Rhonchi present. No decreased breath sounds, wheezing or rales.  Abdominal:     General: There is no distension.     Palpations: Abdomen is soft.     Tenderness: There is abdominal tenderness. There is no guarding or rebound.  Musculoskeletal: Normal range of motion.        General: No swelling or tenderness.     Right lower leg: No edema.     Left lower leg: No edema.  Skin:    General: Skin is warm and dry.     Findings: No erythema or rash.  Neurological:     Mental Status: He is alert. Mental status is at baseline.     Comments: Non-verbal (vocal cord paralysis).  Psychiatric:        Behavior: Behavior is cooperative.     Comments: Non-verbal (vocal cord paralysis).    Thin/emaciated. Diffuse coarse rhonchi (R > L), (-) wheezing. (+) trach. (+) PEG, (+) TTP @ PEG site, (-) erythema/fluctuance. (+) mediastinal vac. LABORATORY PANEL:   CBC Recent Labs  Lab 08/18/18 0237  WBC 10.9*  HGB 10.1*  HCT 31.8*  PLT 365   ------------------------------------------------------------------------------------------------------------------  Chemistries  Recent Labs  Lab 08/18/18 0237  NA 137  K 3.8  CL 96*  CO2 32  GLUCOSE 117*  BUN 11  CREATININE 0.51*  CALCIUM 8.7*  MG 1.7  AST 52*  ALT 68*  ALKPHOS 67  BILITOT 0.5   ------------------------------------------------------------------------------------------------------------------  Cardiac Enzymes No results for input(s): TROPONINI in the last 168 hours. ------------------------------------------------------------------------------------------------------------------  RADIOLOGY:  Dg Chest Port 1 View  Result Date: 08/18/2018 CLINICAL DATA:  53 year old male with history of retropharyngeal abscess and mediastinum 8 S last month. EXAM:  PORTABLE CHEST 1 VIEW COMPARISON:  Chest CT 07/12/2018 and earlier. FINDINGS: Portable AP upright view at 0051 hours. New tracheostomy since February projects over the tracheal air column. New sternotomy. Right upper extremity approach PICC line in place. Small volume right pleural effusion is new. Superimposed streaky right lung base opacity. No pneumothorax identified. Increased pulmonary vascularity without overt edema. Mediastinal contours remain within normal limits. Negative visible bowel gas pattern. No acute osseous abnormality identified. New right lower neck surgical clips. IMPRESSION: 1. Sequelae of sternotomy and tracheostomy tube placed since February with no adverse features. Right upper extremity approach PICC line in place. 2. Small volume right pleural effusion is new since February. Superimposed streaky right lung base opacity is nonspecific and could reflect atelectasis or infection. Electronically Signed   By: Odessa Fleming M.D.   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 Given the complicated nature of this case, I am using a modified bullet-based format for my plan; I would greatly appreciate it if the resultant complaining could be kept to a minimum. Thanks. -Labs, U/A, CXR pending. -c/w ABx (Ertapenem + Fluconazole) via RUE PICC, goal Tx duration x4wks. -PCT. -ID consult. -Mediastinal wound vac to suction. -Wound consult (vac changes T/T/S while pt was at [facility]). -Thoracic surgery consult. -ENT consult. -Respiratory therapy for trach collar. -P/T consult for weakness/deconditioning. -SLP consult for dysphagia, failed MBSS, vocal cord paralysis. -Nutrition consult for tube feeding initiation/recs. -Periop NSTEMI + Afib, Trop-I pending, cardiac monitoring, c/w ASA, Coreg,  Amiodarone. -O2, pulse ox, Duonebs + Pulmicort + Brovana, incentive spirometry, pulmonary toileting. -Pain ctrl (Dilaudid). -S/W + C/M for continued long-term management (home vs. Facility). -c/w other meds. -NPO. -DVT PPx: Lovenox. -Code status: Full code. -Disposition: Admission, > 2 midnights.   Addendum: -Hypochloremia: Volume contraction. Gentle IVF. -Mild hyperglycemia. -Hypocalcemia: Ionized calcium. -Hypoalbuminemia: Thin/emaciated, prolonged hospitalization, suspect malnourishment/FTT. Nutrition consult as above. -Mild transaminasemia: AST 52, ALT 68; < 2x ULN. 03/17/20202 RUQ U/S w/ biliary sludge, (-) pericholecystic fluid, (-) gallbladder wall thickening. CT A/P 07/14/2018 report reads "Mildly enlarged and heterogeneous liver. Periportal edema. No suspicious hepatic lesions. No biliary ductal dilatation." -Mild leukocytosis: WBC < 12.0. Infxn, hemoconcentration. Lactate 0.9. -Normocytic anemia: Hgb 10.1, MCV 89.3. Decreases significantly from 58mo ago, likely 2/2 critical illness. Stable, low suspicion for active/acute bleeding. Monitor.   All the records are reviewed and case discussed with ED provider. Management plans discussed with the patient, family and they are in agreement.  CODE STATUS: Full code.  TOTAL TIME TAKING CARE OF THIS PATIENT: 75 minutes.    Barbaraann Rondo M.D on 08/18/2018 at 3:59 AM  Between 7am to 6pm - Pager - [phone]  After 6pm go to [email or web address] - Social research officer, government  [facility] [place] [facility]  Office  [phone]  CC: Primary care physician; Patient, No Pcp Per   Note: This dictation was prepared with Dragon dictation along with smaller phrase technology. Any transcriptional errors that result from this process are unintentional.

## 2018-08-18 NOTE — Progress Notes (Signed)
Patient ID: Donald Abbott, male   DOB: June 02, 1965, 53 y.o.   MRN: [id number]  No chief complaint on file.   Referred By Dr. Milagros Loll Reason for Referral management of wound VAC  HPI Location, Quality, Duration, Severity, Timing, Context, Modifying Factors, Associated Signs and Symptoms.  Donald Abbott is a 53 y.o. male.  This is a complicated patient.  I did reviewed his records with Dr. Elpidio Anis by phone.  He is a 53 year old gentleman who was seen at our institution approximately 1 month ago for a retropharyngeal abscess with mediastinitis.  He is undergone several procedures at [facility] including a tracheostomy and a median sternotomy.  He currently has a wound VAC in place.  He also has a gastrostomy tube in place that appears to be inserted through a laparoscopic approach.  The patient also has a history of vocal cord paralysis and because he is currently trached he is unable to give any meaningful history.  He is able to write and appears awake alert and appropriate.  He tells me that his wound VAC has been changed on the floor on Tuesdays Thursdays and Saturdays.  He states that this did not occur in the operating room.  I have attempted to review all the notes available in care everywhere and I cannot find an operative note by Dr. Merrie Roof for the original surgery but it does sound as if the patient had several sternotomies done and ultimately had the skin and soft tissues open with wound VAC closure.  I am being asked to see him now for management of his wound VAC.   Past Medical History:  Diagnosis Date  . COPD (chronic obstructive pulmonary disease) (HCC)     History reviewed. No pertinent surgical history.  History reviewed. No pertinent family history.  Social History Social History   Tobacco Use  . Smoking status: Former Games developer  . Smokeless tobacco: Never Used  Substance Use Topics  . Alcohol use: Yes  . Drug use: Not on file    Allergies  Allergen Reactions  .  Other     Dog and cat dander intermittently    Current Facility-Administered Medications  Medication Dose Route Frequency Provider Last Rate Last Dose  . acetaminophen (TYLENOL) tablet 650 mg  650 mg Per Tube Q6H PRN Barbaraann Rondo, MD       Or  . acetaminophen (TYLENOL) suppository 650 mg  650 mg Rectal Q6H PRN Barbaraann Rondo, MD      . amiodarone (PACERONE) tablet 200 mg  200 mg Per Tube Daily Barbaraann Rondo, MD      . arformoterol (BROVANA) nebulizer solution 15 mcg  15 mcg Nebulization BID Barbaraann Rondo, MD   15 mcg at 08/18/18 0713  . aspirin chewable tablet 81 mg  81 mg Per Tube Daily Barbaraann Rondo, MD      . budesonide (PULMICORT) nebulizer solution 0.25 mg  0.25 mg Nebulization Daily Barbaraann Rondo, MD   0.25 mg at 08/18/18 0713  . carvedilol (COREG) tablet 3.125 mg  3.125 mg Per Tube BID WC Barbaraann Rondo, MD      . chlorhexidine (PERIDEX) 0.12 % solution 5 mL  5 mL Mouth/Throat BID Marjie Skiff, Prasanna, MD      . enoxaparin (LOVENOX) injection 40 mg  40 mg Subcutaneous Q24H Marjie Skiff, Prasanna, MD      . ertapenem (INVANZ) 1,000 mg in sodium chloride 0.9 % 100 mL IVPB  1 g Intravenous Q24H Barbaraann Rondo, MD   Stopped at 08/18/18  0423  . fluconazole (DIFLUCAN) 40 MG/ML suspension 400 mg  400 mg Per Tube Daily Barbaraann Rondo, MD      . folic acid (FOLVITE) tablet 1 mg  1 mg Per Tube Daily Sridharan, Prasanna, MD      . HYDROmorphone (DILAUDID) injection 0.5 mg  0.5 mg Intravenous Q3H PRN Barbaraann Rondo, MD       Or  . HYDROmorphone (DILAUDID) injection 1 mg  1 mg Intravenous Q3H PRN Barbaraann Rondo, MD   1 mg at 08/18/18 0751  . ipratropium-albuterol (DUONEB) 0.5-2.5 (3) MG/3ML nebulizer solution 3 mL  3 mL Nebulization Q4H PRN Barbaraann Rondo, MD      . lactated ringers infusion   Intravenous Continuous Barbaraann Rondo, MD 62.5 mL/hr at 08/18/18 0450    . MEDLINE mouth rinse  15 mL Mouth Rinse BID Barbaraann Rondo, MD       . Melene Muller ON 08/19/2018] Melatonin TABS 5 mg  5 mg Per Tube QHS Barbaraann Rondo, MD      . multivitamin liquid 15 mL  15 mL Per Tube Daily Barbaraann Rondo, MD      . OLANZapine (ZYPREXA) tablet 2.5 mg  2.5 mg Per Tube QHS PRN Barbaraann Rondo, MD      . ondansetron (ZOFRAN) injection 4 mg  4 mg Intravenous Q6H PRN Barbaraann Rondo, MD      . polyethylene glycol (MIRALAX / GLYCOLAX) packet 17 g  17 g Per Tube BID Barbaraann Rondo, MD      . psyllium (HYDROCIL/METAMUCIL) packet 1 packet  1 packet Per Tube BID Barbaraann Rondo, MD      . senna-docusate (Senokot-S) tablet 1 tablet  1 tablet Per Tube QHS PRN Barbaraann Rondo, MD      . sodium chloride (OCEAN) 0.65 % nasal spray 1 spray  1 spray Each Nare PRN Barbaraann Rondo, MD      . sodium chloride HYPERTONIC 3 % nebulizer solution 4 mL  4 mL Nebulization BID Barbaraann Rondo, MD      . thiamine (VITAMIN B-1) tablet 100 mg  100 mg Per Tube Daily Barbaraann Rondo, MD          Review of Systems A complete review of systems was asked and was negative except for the following positive findings unable to completely obtain because the patient has a tracheostomy and communication is very difficult.  Blood pressure (!) 95/53, pulse 69, temperature 98.8 F (37.1 C), resp. rate 19, height 5\' 8"  (1.727 m), weight 50.6 kg, SpO2 98 %.  Physical Exam CONSTITUTIONAL:  Pleasant, well-developed, well-nourished, and in no acute distress.  He does have a tracheostomy tube in place.  He appears in no distress.   LYMPH NODES:  Lymph nodes in the neck and axillae were normal RESPIRATORY:  Lungs were clear.  Normal respiratory effort without pathologic use of accessory muscles of respiration.  There are no chest incisions.  There are 3 chest tube sites inferior to his wound VAC.  The sternum is stable.  There is no erythema. CARDIOVASCULAR: Heart was regular without murmurs.  There were no carotid bruits. GI: The abdomen was soft,  nontender, and nondistended. There were no palpable masses. There was no hepatosplenomegaly. There were normal bowel sounds in all quadrants.  The patient has a gastrostomy tube in the right upper quadrant GU:  Rectal deferred.   MUSCULOSKELETAL:  Normal muscle strength and tone.  No clubbing or cyanosis.   SKIN:  There were no pathologic skin lesions.  There were no  nodules on palpation. NEUROLOGIC:  Sensation is normal.  Cranial nerves are grossly intact. PSYCH:  Oriented to person, place and time.  Mood and affect are normal.  Data Reviewed There is a chest x-ray from yesterday which shows median sternotomy wires to be intact.  There is no pneumothorax.  There is a right pleural effusion.  I have personally reviewed the patient's imaging, laboratory findings and medical records.    Assessment    Status post multiple surgical interventions for a retropharyngeal abscess and mediastinitis    Plan    I have discussed his care with Dr. Milagros Loll.  We will try to obtain all the records from [facility].  We will change his wound VAC today.  I will attempt to meet with his family to get a plan for care.     Hulda Marin, MD 08/18/2018, 8:22 AM   Patient ID: Donald Abbott, male   DOB: 06-May-1966, 53 y.o.   MRN: [id number]

## 2018-08-18 NOTE — Evaluation (Signed)
Physical Therapy Evaluation Patient Details Name: Donald Abbott MRN: [id number] DOB: 07/16/1965 Today's Date: 08/18/2018   History of Present Illness  53 y.o. male with a known history of COPD (no O2) presenting for direct admission as a transfer from [facility]. 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 Pt was transferred to tertiary care center [facility]). He had a long and complicated hospital course lasting well over one month.    Clinical Impression  Pt did well with set up to get ready for ambulation (showed ability to don socks and sandals) and though on initial standing attempt he did not rise with cuing for hand placement and set up he was able to rise with direct assist.  Pt generally did well with slow, cautious gait but clearly started to have increased pain with increased distance, cues for posture, cadence, breathing and walker use.  Pt will have assist at home and will be able to return safely once medically cleared, will benefit from HHPT follow up in working back to PLOF.    Follow Up Recommendations Home health PT    Equipment Recommendations  Rolling walker with 5" wheels    Recommendations for Other Services       Precautions / Restrictions Precautions Precautions: Fall Restrictions Weight Bearing Restrictions: No      Mobility  Bed Mobility Overal bed mobility: Independent             General bed mobility comments: Pt able to get up to sitting and back to supine slowly, but w/o assist  Transfers Overall transfer level: Modified independent Equipment used: Rolling walker (2 wheeled)             General transfer comment: Pt able to rise with cues to use UEs on bed (not walker), labored but w/o direct assist  Ambulation/Gait Ambulation/Gait assistance: Supervision Gait Distance (Feet): 200 Feet Assistive device: Rolling walker (2 wheeled)       General Gait Details: Pt was able to  maintain slow but consistent gait, forward flexed, increased pain with increased distance.  Pt's O2 remained in the 90s t/o the effort.   Stairs            Wheelchair Mobility    Modified Rankin (Stroke Patients Only)       Balance Overall balance assessment: Modified Independent(reliant on walker, no LOBs)                                           Pertinent Vitals/Pain Pain Assessment: 0-10 Pain Score: 9  Pain Location: feed tube site, chest, abdomen    Home Living Family/patient expects to be discharged to:: Private residence Living Arrangements: Spouse/significant other Available Help at Discharge: Family;Available 24 hours/day           Home Equipment: None      Prior Function Level of Independence: Independent         Comments: Pt has been hosptialized for >1 month, signficant changes in recent baseline function     Hand Dominance        Extremity/Trunk Assessment   Upper Extremity Assessment Upper Extremity Assessment: Generalized weakness    Lower Extremity Assessment Lower Extremity Assessment: Generalized weakness;Overall WFL for tasks assessed       Communication   Communication: Tracheostomy(no verbalization)  Cognition Arousal/Alertness: Awake/alert Behavior During Therapy: [facility]  for tasks assessed/performed Overall Cognitive Status: Within Functional Limits for tasks assessed                                        General Comments      Exercises     Assessment/Plan    PT Assessment Patient needs continued PT services  PT Problem List Decreased strength;Decreased range of motion;Decreased activity tolerance;Decreased balance;Decreased mobility;Decreased coordination;Decreased knowledge of use of DME;Decreased safety awareness;Pain;Cardiopulmonary status limiting activity       PT Treatment Interventions DME instruction;Gait training;Stair training;Functional mobility training;Therapeutic  activities;Therapeutic exercise;Balance training;Neuromuscular re-education;Patient/family education    PT Goals (Current goals can be found in the Care Plan section)  Acute Rehab PT Goals Patient Stated Goal: go home PT Goal Formulation: With patient Time For Goal Achievement: 09/01/18 Potential to Achieve Goals: Good    Frequency Min 2X/week   Barriers to discharge        Co-evaluation               AM-PAC PT "6 Clicks" Mobility  Outcome Measure Help needed turning from your back to your side while in a flat bed without using bedrails?: None Help needed moving from lying on your back to sitting on the side of a flat bed without using bedrails?: None Help needed moving to and from a bed to a chair (including a wheelchair)?: None Help needed standing up from a chair using your arms (e.g., wheelchair or bedside chair)?: A Little Help needed to walk in hospital room?: None Help needed climbing 3-5 steps with a railing? : A Little 6 Click Score: 22    End of Session Equipment Utilized During Treatment: Gait belt Activity Tolerance: Patient limited by fatigue Patient left: with call bell/phone within reach;with bed alarm set Nurse Communication: Mobility status PT Visit Diagnosis: Muscle weakness (generalized) (M62.81);Difficulty in walking, not elsewhere classified (R26.2)    Time: 0086-7619 PT Time Calculation (min) (ACUTE ONLY): 30 min   Charges:   PT Evaluation $PT Eval Low Complexity: 1 Low PT Treatments $Gait Training: 8-22 mins        Malachi Pro, DPT 08/18/2018, 4:38 PM

## 2018-08-18 NOTE — Consult Note (Signed)
WOC Nurse wound consult note Patient receiving care in [facility].  No visitors present. 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  The wound bed appears as thin yellow surrounded by purple/blanchable tissue.  The patient had a foam dressing over it. Measurement: Sternal wound 17 cm x 4.2 cm x 1.1 cm.  The wound bed is approximately 50 % pink granluation tissue, and 50% tissue that is yellow in color, but does not appear consistent with slough, fascia perhaps?  There are 5 twisted wires along the wound as well.  There is no odor, no induration and the drainage in the cannister is serous.  Dressing procedure/placement/frequency: MWF NPWT dressing by WOC nurse.  The current VAC pump was set at 50 mmHg pressure.  I did not change that setting.  The patient stated the staff at [facility] changed it 3 x weekly on MWF. For the Right ischial PI, a hydrocolloid daily.  I will add orders for an air mattress, turning and Use ONLY ONE DermaTherapy pad beneath patient's buttocks. Do NOT use disposable pads.  Helmut Muster, RN, MSN, CWOCN, CNS-BC, pager [phone]

## 2018-08-19 ENCOUNTER — Inpatient Hospital Stay: Payer: Medicaid Other

## 2018-08-19 DIAGNOSIS — L8990 Pressure ulcer of unspecified site, unspecified stage: Secondary | ICD-10-CM

## 2018-08-19 DIAGNOSIS — E43 Unspecified severe protein-calorie malnutrition: Secondary | ICD-10-CM

## 2018-08-19 LAB — COMPREHENSIVE METABOLIC PANEL
ALT: 54 U/L — AB (ref 0–44)
AST: 38 U/L (ref 15–41)
Albumin: 3 g/dL — ABNORMAL LOW (ref 3.5–5.0)
Alkaline Phosphatase: 59 U/L (ref 38–126)
Anion gap: 6 (ref 5–15)
BUN: 6 mg/dL (ref 6–20)
CO2: 28 mmol/L (ref 22–32)
CREATININE: 0.52 mg/dL — AB (ref 0.61–1.24)
Calcium: 9 mg/dL (ref 8.9–10.3)
Chloride: 101 mmol/L (ref 98–111)
GFR calc Af Amer: >60 mL/min (ref >60)
GFR calc non Af Amer: >60 mL/min (ref >60)
Glucose, Bld: 108 mg/dL — ABNORMAL HIGH (ref 70–99)
Potassium: 4.2 mmol/L (ref 3.5–5.1)
Sodium: 135 mmol/L (ref 135–145)
Total Bilirubin: 0.4 mg/dL (ref 0.3–1.2)
Total Protein: 7 g/dL (ref 6.5–8.1)

## 2018-08-19 LAB — CBC WITH DIFFERENTIAL/PLATELET
Abs Immature Granulocytes: 0.04 10*3/uL (ref 0.00–0.07)
Basophils Absolute: 0.1 10*3/uL (ref 0.0–0.1)
Basophils Relative: 1 %
EOS PCT: 4 %
Eosinophils Absolute: 0.3 10*3/uL (ref 0.0–0.5)
HCT: 33.4 % — ABNORMAL LOW (ref 39.0–52.0)
Hemoglobin: 10.7 g/dL — ABNORMAL LOW (ref 13.0–17.0)
Immature Granulocytes: 0 %
Lymphocytes Relative: 22 %
Lymphs Abs: 2 10*3/uL (ref 0.7–4.0)
MCH: 28.5 pg (ref 26.0–34.0)
MCHC: 32 g/dL (ref 30.0–36.0)
MCV: 89.1 fL (ref 80.0–100.0)
Monocytes Absolute: 0.7 10*3/uL (ref 0.1–1.0)
Monocytes Relative: 8 %
Neutro Abs: 6 10*3/uL (ref 1.7–7.7)
Neutrophils Relative %: 65 %
Platelets: 330 10*3/uL (ref 150–400)
RBC: 3.75 MIL/uL — ABNORMAL LOW (ref 4.22–5.81)
RDW: 15.7 % — ABNORMAL HIGH (ref 11.5–15.5)
WBC: 9.2 10*3/uL (ref 4.0–10.5)
nRBC: 0 % (ref 0.0–0.2)

## 2018-08-19 LAB — HIV ANTIBODY (ROUTINE TESTING W REFLEX): HIV Screen 4th Generation wRfx: NONREACTIVE

## 2018-08-19 LAB — CALCIUM, IONIZED: Calcium, Ionized, Serum: 5 mg/dL (ref 4.5–5.6)

## 2018-08-19 MED ORDER — INFLUENZA VAC SPLIT QUAD 0.5 ML IM SUSY
0.5000 mL | PREFILLED_SYRINGE | INTRAMUSCULAR | Status: AC
  Administered 2018-08-22: 11:00:00 0.5 mL via INTRAMUSCULAR
  Filled 2018-08-19: qty 0.5

## 2018-08-19 MED ORDER — PNEUMOCOCCAL VAC POLYVALENT 25 MCG/0.5ML IJ INJ
0.5000 mL | INJECTION | INTRAMUSCULAR | Status: AC
  Administered 2018-08-22: 0.5 mL via INTRAMUSCULAR
  Filled 2018-08-19: qty 0.5

## 2018-08-19 MED ORDER — IOHEXOL 300 MG/ML  SOLN
75.0000 mL | Freq: Once | INTRAMUSCULAR | Status: AC | PRN
  Administered 2018-08-19: 13:00:00 75 mL via INTRAVENOUS

## 2018-08-19 MED ORDER — OXYCODONE HCL 5 MG/5ML PO SOLN
5.0000 mg | ORAL | Status: DC | PRN
  Administered 2018-08-19 – 2018-08-24 (×11): 5 mg
  Filled 2018-08-19 (×11): qty 5

## 2018-08-19 MED ORDER — IOHEXOL 240 MG/ML SOLN
25.0000 mL | INTRAMUSCULAR | Status: AC
  Administered 2018-08-19 (×2): 25 mL via ORAL

## 2018-08-19 MED ORDER — PIPERACILLIN-TAZOBACTAM 3.375 G IVPB
3.3750 g | Freq: Three times a day (TID) | INTRAVENOUS | Status: DC
  Administered 2018-08-19 – 2018-08-20 (×3): 3.375 g via INTRAVENOUS
  Filled 2018-08-19 (×4): qty 50

## 2018-08-19 MED ORDER — ENOXAPARIN SODIUM 40 MG/0.4ML ~~LOC~~ SOLN
40.0000 mg | SUBCUTANEOUS | Status: DC
  Administered 2018-08-19 – 2018-08-22 (×4): 40 mg via SUBCUTANEOUS
  Filled 2018-08-19 (×4): qty 0.4

## 2018-08-19 MED ORDER — OSMOLITE 1.5 CAL PO LIQD
1000.0000 mL | ORAL | Status: DC
  Administered 2018-08-19 (×2): 1000 mL

## 2018-08-19 NOTE — Progress Notes (Signed)
Nutrition Brief Note   Spoke with RN, pt complaining of abdominal pain today and tube feeds are being held. Tube feeds ran overnight at 72ml/hr. Will switch tube feed formula to Osmolite as this formula is without fiber and pt may tolerate this better.   INTERVENTION:   Change to Osmolite 1.5 @ goal rate of 1ml/hr- Initiate at 76ml/hr and increase by 3ml/hr every 8 hours until goal rate is reached.   Free water flushes 30ml q 4 hours  Regimen provides 1980kcal/day, 83g/day protein, 0g/day fiber, 1338ml/day free water  Juven Fruit Punch BID via tube, each serving provides 95kcal and 2.5g of protein (amino acids glutamine and arginine)  Vitamin C 250mg  BID via tube   Pt likely at refeed risk; recommend monitor K, Mg and P for 3 days after tube feed initiation    Estimated Nutritional Needs:   Kcal:  1800-2100kcal/day   Protein:  80-90g/day   Fluid:  >1.5L/day   Betsey Holiday MS, RD, LDN Pager #- [phone] Office#- [phone] After Hours Pager: [phone]

## 2018-08-19 NOTE — Plan of Care (Signed)

## 2018-08-19 NOTE — Progress Notes (Signed)
[employer] [facility] [place] at [facility]   PATIENT NAME: Donald Abbott    MR#:  [id number]  DATE OF BIRTH:  09-23-1965  SUBJECTIVE:    c/o abdominal pain Tube feeds held due to abdominal pain  REVIEW OF SYSTEMS:    Review of Systems  Constitutional: Negative for fever, chills weight loss HENT: Negative for ear pain, nosebleeds, congestion, facial swelling, rhinorrhea, neck pain, neck stiffness and ear discharge.   Respiratory: Negative for cough, shortness of breath, wheezing  Cardiovascular: Negative for chest pain, palpitations and leg swelling.  Gastrointestinal: Negative for heartburn, ++abdominal pain, novomiting, diarrhea or consitpation Genitourinary: Negative for dysuria, urgency, frequency, hematuria Musculoskeletal: Negative for back pain or joint pain Neurological: Negative for dizziness, seizures, syncope, focal weakness,  numbness and headaches.  Hematological: Does not bruise/bleed easily.  Psychiatric/Behavioral: Negative for hallucinations, confusion, dysphoric mood    Tolerating Diet: Tube feeds held due to abdominal pain      DRUG ALLERGIES:   Allergies  Allergen Reactions  . Other     Dog and cat dander intermittently    VITALS:  Blood pressure 102/61, pulse 72, temperature 98 F (36.7 C), temperature source Oral, resp. rate 18, height 5\' 8"  (1.727 m), weight 50.6 kg, SpO2 98 %.  PHYSICAL EXAMINATION:  Constitutional: Appears well-developed and well-nourished. No distress. HENT: Normocephalic. Marland Kitchen Achy ostomy placed   eyes: Conjunctivae and EOM are normal. PERRLA, no scleral icterus.  Neck: Normal ROM. Neck supple. No JVD. No tracheal deviation. CVS: RRR, S1/S2 +, no murmurs, no gallops, no carotid bruit.  Pulmonary: Coarse breath sounds bilaterally with normal respiratory effort Abdominal: Soft. BS +,  no distension, tenderness, rebound or guarding. PEG without signs of infection Musculoskeletal: Normal range of motion. No edema and  no tenderness.  Neuro: Alert. CN 2-12 grossly intact. No focal deficits. Skin: There are 3 chest tube sites inferior to his wound VAC. No chest incisions Psychiatric: Normal mood and affect.      LABORATORY PANEL:   CBC Recent Labs  Lab 08/18/18 0237  WBC 10.9*  HGB 10.1*  HCT 31.8*  PLT 365   ------------------------------------------------------------------------------------------------------------------  Chemistries  Recent Labs  Lab 08/18/18 0237  NA 137  K 3.8  CL 96*  CO2 32  GLUCOSE 117*  BUN 11  CREATININE 0.51*  CALCIUM 8.7*  MG 1.7  AST 52*  ALT 68*  ALKPHOS 67  BILITOT 0.5   ------------------------------------------------------------------------------------------------------------------  Cardiac Enzymes Recent Labs  Lab 08/18/18 0243  TROPONINI <0.03   ------------------------------------------------------------------------------------------------------------------  RADIOLOGY:  Dg Chest Port 1 View  Result Date: 08/18/2018 CLINICAL DATA:  53 year old male with history of retropharyngeal abscess and mediastinum 8 S last month. EXAM: PORTABLE CHEST 1 VIEW COMPARISON:  Chest CT 07/12/2018 and earlier. FINDINGS: Portable AP upright view at 0051 hours. New tracheostomy since February projects over the tracheal air column. New sternotomy. Right upper extremity approach PICC line in place. Small volume right pleural effusion is new. Superimposed streaky right lung base opacity. No pneumothorax identified. Increased pulmonary vascularity without overt edema. Mediastinal contours remain within normal limits. Negative visible bowel gas pattern. No acute osseous abnormality identified. New right lower neck surgical clips. IMPRESSION: 1. Sequelae of sternotomy and tracheostomy tube placed since February with no adverse features. Right upper extremity approach PICC line in place. 2. Small volume right pleural effusion is new since February. Superimposed streaky  right lung base opacity is nonspecific and could reflect atelectasis or infection. Electronically Signed   By: Odessa Fleming  M.D.   On: 08/18/2018 01:05     ASSESSMENT AND PLAN:   53 year old male with history of COPD who initially presented to [facility] February 19 with difficulty breathing and was diagnosed with retropharyngeal abscess with mediastinal extension and therefore was transferred to [facility] and had a prolonged hospitalization and now comes back to [facility] for continued treatment.  1.  Retropharyngeal abscess extending into mediastinal: Patient has been evaluated by ENT, cardiothoracic surgery and ID. ID is recommending Zosyn and fluconazole. At the time of discharge patient will be discharged on ertapenem via PEG. Antibiotics to be continued until April 4,2020. ENT is recommending that patient have outpatient follow-up as already planned on Dr. Roma Schanz April 15 and Dr. Sherryll Burger April 21. Continue tracheostomy care. Respiratory therapy he is cleared to perform trach changes if needed.  Modified barium planned for Monday.  2.  Abdominal pain: We will check CT of the abdomen   3.  Wound VAC: Cardiothoracic surgery is following patient.  Wound VAC was changed yesterday.  4. NUTRITION:Tube feeds changes as per diatary.  management plans discussed with the patient and he is in agreement.  CODE STATUS: full  TOTAL TIME TAKING CARE OF THIS PATIENT: 28 minutes.     POSSIBLE D/C Monday with HHC, DEPENDING ON CLINICAL CONDITION.   Adrian Saran M.D on 08/19/2018 at 11:26 AM  Between 7am to 6pm - Pager - [phone] After 6pm go to [email or web address] - password [email or web address]  [facility] [place] [facility]  Office  [phone]  CC: Primary care physician; Patient, No Pcp Per  Note: This dictation was prepared with Dragon dictation along with smaller phrase technology. Any transcriptional errors that result from this process are unintentional.

## 2018-08-20 DIAGNOSIS — Z931 Gastrostomy status: Secondary | ICD-10-CM

## 2018-08-20 MED ORDER — PIPERACILLIN-TAZOBACTAM 3.375 G IVPB
3.3750 g | Freq: Three times a day (TID) | INTRAVENOUS | Status: DC
  Administered 2018-08-20 – 2018-08-21 (×3): 3.375 g via INTRAVENOUS
  Filled 2018-08-20 (×3): qty 50

## 2018-08-20 NOTE — Progress Notes (Signed)
[employer] [facility] [place] at [facility]   PATIENT NAME: Kalet Vanwye    MR#:  [id number]  DATE OF BIRTH:  12-29-1965  SUBJECTIVE:   Abdominal pain seems to have improved.  Patient seen by surgery yesterday.  REVIEW OF SYSTEMS:    Review of Systems  Constitutional: Negative for fever, chills weight loss HENT: Negative for ear pain, nosebleeds, congestion, facial swelling, rhinorrhea, neck pain, neck stiffness and ear discharge.   Respiratory: Negative for cough, shortness of breath, wheezing  Cardiovascular: Negative for chest pain, palpitations and leg swelling.  Gastrointestinal: Negative for heartburn, ++abdominal pain (improved), no vomiting, diarrhea or consitpation Genitourinary: Negative for dysuria, urgency, frequency, hematuria Musculoskeletal: Negative for back pain or joint pain Neurological: Negative for dizziness, seizures, syncope, focal weakness,  numbness and headaches.  Hematological: Does not bruise/bleed easily.  Psychiatric/Behavioral: Negative for hallucinations, confusion, dysphoric mood    Tolerating Diet: Tube feeds    DRUG ALLERGIES:   Allergies  Allergen Reactions  . Other     Dog and cat dander intermittently    VITALS:  Blood pressure 102/69, pulse 75, temperature 97.7 F (36.5 C), temperature source Oral, resp. rate 16, height 5\' 8"  (1.727 m), weight 50.6 kg, SpO2 94 %.  PHYSICAL EXAMINATION:  Constitutional: Appears well-developed and well-nourished. No distress. HENT: Normocephalic. Marland Kitchen Achy ostomy placed   eyes: Conjunctivae and EOM are normal. PERRLA, no scleral icterus.  Neck: Normal ROM. Neck supple. No JVD. No tracheal deviation. CVS: RRR, S1/S2 +, no murmurs, no gallops, no carotid bruit.  Pulmonary: Coarse breath sounds bilaterally with normal respiratory effort Abdominal: Soft. BS +,  no distension,. PEG without signs of infection mild tenderness around PEG tube but no rebound or guarding Musculoskeletal: Normal  range of motion. No edema and no tenderness.  Neuro: Alert. CN 2-12 grossly intact. No focal deficits. Skin: There are 3 chest tube sites inferior to his wound VAC. No chest incisions Psychiatric: Normal mood and affect.      LABORATORY PANEL:   CBC Recent Labs  Lab 08/19/18 1641  WBC 9.2  HGB 10.7*  HCT 33.4*  PLT 330   ------------------------------------------------------------------------------------------------------------------  Chemistries  Recent Labs  Lab 08/18/18 0237 08/19/18 1641  NA 137 135  K 3.8 4.2  CL 96* 101  CO2 32 28  GLUCOSE 117* 108*  BUN 11 6  CREATININE 0.51* 0.52*  CALCIUM 8.7* 9.0  MG 1.7  --   AST 52* 38  ALT 68* 54*  ALKPHOS 67 59  BILITOT 0.5 0.4   ------------------------------------------------------------------------------------------------------------------  Cardiac Enzymes Recent Labs  Lab 08/18/18 0243  TROPONINI <0.03   ------------------------------------------------------------------------------------------------------------------  RADIOLOGY:  Ct Abdomen Pelvis W Contrast  Result Date: 08/19/2018 CLINICAL DATA:  Generalized abdominal pain. Elevated lipase. Evaluate for pancreatitis. History of recent retropharyngeal abscess with mediastinal extension. EXAM: CT ABDOMEN AND PELVIS WITH CONTRAST TECHNIQUE: Multidetector CT imaging of the abdomen and pelvis was performed using the standard protocol following bolus administration of intravenous contrast. CONTRAST:  59mL OMNIPAQUE IOHEXOL 300 MG/ML  SOLN COMPARISON:  Chest CT of 07/12/2018. Chest radiograph of 08/18/2018. FINDINGS: Lower chest: Right greater than left base airspace disease. Small right larger than left pleural effusions. Hepatobiliary: Suspected too small to characterize segment 2 liver lesion. Hepatomegaly at 20.6 cm. Normal gallbladder, without biliary ductal dilatation. Pancreas: The pancreas enhances normally, without peripancreatic edema or duct dilatation.  Spleen: Normal in size, without focal abnormality. Adrenals/Urinary Tract: Normal adrenal glands. Normal kidneys, without hydronephrosis. Normal urinary bladder. Stomach/Bowel: Gastrostomy tube,  appropriately positioned. Colonic stool burden suggests constipation. Appendix not visualized. Normal small bowel caliber. Vascular/Lymphatic: Aortic and branch vessel atherosclerosis. No abdominopelvic adenopathy. Reproductive: Normal prostate. Other: No significant free fluid. Small volume free intraperitoneal air is identified along the liver edge, including on images 19 and 20 of series 2. Musculoskeletal: No acute osseous abnormality. Degenerate disc disease at the lumbosacral junction. IMPRESSION: 1. Trace free intraperitoneal air, indeterminate. A gastrostomy tube has been placed since 07/12/2018. If this is recently placed, this may explain the free intraperitoneal air. Otherwise, this is suggestive of bowel perforation, but no localizing source is seen. 2. No evidence of pancreatitis. 3.  Possible constipation.  No other explanation for pain. 4. Small bilateral pleural effusions with adjacent atelectasis. These results will be called to the ordering clinician or representative by the Radiologist Assistant, and communication documented in the PACS or zVision Dashboard. Electronically Signed   By: Jeronimo Greaves M.D.   On: 08/19/2018 14:09     ASSESSMENT AND PLAN:   53 year old male with history of COPD who initially presented to [facility] February 19 with difficulty breathing and was diagnosed with retropharyngeal abscess with mediastinal extension and therefore was transferred to [facility] and had a prolonged hospitalization and now comes back to [facility] for continued treatment.  1.  Retropharyngeal abscess extending into mediastinal: Patient has been evaluated by ENT, cardiothoracic surgery and ID. ID is recommending Zosyn and fluconazole. At the time of discharge patient will be discharged on ertapenem via  PEG. Antibiotics to be continued until April 4,2020. ENT is recommending that patient have outpatient follow-up as already planned on Dr. Roma Schanz April 15 and Dr. Sherryll Burger April 21. Continue tracheostomy care. Respiratory therapy he is cleared to perform trach changes if needed.  Modified barium planned for Monday.  2.  Abdominal pain: CT the abdomen was concerning for some free air and therefore I consulted surgery. Patient has had pneumoperitoneum seen on previous CT scan from March 24 and surgery feels that this is residual gas from insufflation from his surgery. There is no acute surgical need. Patient will need keep tube in place for at least 6 weeks.  He will follow-up with the surgeons at [facility].   3.  Wound VAC: Cardiothoracic surgery is following patient.  Wound VAC was changed Friday.  4. NUTRITION:Tube feeds changes as per diatary.  management plans discussed with the patient and he is in agreement.  Case discussed with Dr. Aleen Campi yesterday  CODE STATUS: full  TOTAL TIME TAKING CARE OF THIS PATIENT: 24 minutes.     POSSIBLE D/C Monday with HHC, DEPENDING ON CLINICAL CONDITION.   Adrian Saran M.D on 08/20/2018 at 10:39 AM  Between 7am to 6pm - Pager - [phone] After 6pm go to [email or web address] - password [email or web address]  [facility] [place] [facility]  Office  [phone]  CC: Primary care physician; Patient, No Pcp Per  Note: This dictation was prepared with Dragon dictation along with smaller phrase technology. Any transcriptional errors that result from this process are unintentional.

## 2018-08-20 NOTE — Progress Notes (Signed)
Tracheal suctioned for moderate amt tan secretions.  Tol well

## 2018-08-20 NOTE — Plan of Care (Signed)

## 2018-08-20 NOTE — Consult Note (Signed)
Date of Consultation:  08/19/2018  Requesting Physician:  Adrian Saran, MD  Reason for Consultation:  Pneumoperitoneum and abdominal pain  History of Present Illness: Donald Abbott is a 53 y.o. male who had presented to [facility] on 2/19 with retropharyngeal and mediastinal abscess, transferred to [facility] same day and transferred back to [facility] on 3/26.  He underwent multiple surgeries for incision and drainage and debridement of the abscess, and currently has a wound VAC on his sternum wound.  He also had a tracheostomy done as well as a laparoscopic gastrostomy tube placement on 3/24.  He has been complaining of abdominal pain around the tube site since his surgery and a CT scan was done today which showed pneumoperitoneum.  Surgery has been consulted in this setting given his abdominal pain and free air noted on CAT scan.  Patient reports that he still having pain around the tube site but is not diffuse otherwise.  Denies any nausea.  Having normal bowel movements.  Have independently viewed the CT scan here and there is only very small bubbles of gas in the upper abdomen.  There is no evidence of inflammation to indicate a source for perforation.  He did have a CT scan of the chest at [facility] on 3/26 and the abdominal portion of it also mentions pneumoperitoneum.  I am not able to see the CT of the chest as it was done at outside facility.  Past Medical History: Past Medical History:  Diagnosis Date  . COPD (chronic obstructive pulmonary disease) (HCC)      Past Surgical History: --Sternotomy, mediastinal abscess debridement, tracheostomy, laparoscopic gastrostomy tube  Home Medications: Prior to Admission medications   Medication Sig Start Date End Date Taking? Authorizing Provider  Acetaminophen 650 MG/20.3ML SOLN 20.3 mLs by Enteral route every 4 (four) hours as needed. 08/17/18  Yes [provider]  albuterol (PROVENTIL) (2.5 MG/3ML) 0.083% nebulizer solution Inhale 3 mLs into the lungs  every 6 (six) hours as needed for wheezing or shortness of breath. 08/17/18 08/17/19 Yes [provider]  amiodarone (PACERONE) 200 MG tablet 1 tablet (200 mg total) by G-tube route daily. 08/18/18 11/16/18 Yes [provider]  arformoterol (BROVANA) 15 MCG/2ML NEBU Take 15 mcg by nebulization 2 (two) times daily.  08/17/18  Yes [provider]  aspirin 81 MG chewable tablet 1 tablet (81 mg total) by G-tube route daily. 08/18/18 09/17/18 Yes [provider]  budesonide (PULMICORT) 0.25 MG/2ML nebulizer solution Inhale 2 mLs into the lungs daily. 08/18/18 09/02/18 Yes [provider]  carvedilol (COREG) 3.125 MG tablet 1 tablet (3.125 mg total) by G-tube route Two (2) times a day. 08/17/18 09/16/18 Yes [provider]  chlorhexidine (PERIDEX) 0.12 % solution 5 mL by Mouth route two (2) times a day. 08/17/18  Yes [provider]  ertapenem 500 mg in sodium chloride 0.9 % 50 mL Inject into the vein. Infuse 1 gram into a venous catheter daily for 8 days 08/18/18 08/26/18 Yes [provider]  fluconazole (DIFLUCAN) 200 MG tablet 2 tablets (400 mg total) by G-tube route daily for 8 days. 08/18/18 08/26/18 Yes [provider]  hydrALAZINE (APRESOLINE) 20 MG/ML injection Inject 1 mL into the vein every 4 (four) hours as needed. (Give for SBP>160 after Labetalol) 08/17/18 09/16/18 Yes [provider]  labetalol (NORMODYNE,TRANDATE) 5 MG/ML injection Inject 4 mLs into the vein every 4 (four) hours as needed. (Give for SBP>160 or HR>100) 08/17/18 09/16/18 Yes [provider]  loperamide (IMODIUM)  2 MG capsule Take 1 capsule by mouth 2 (two) times daily as needed. 08/17/18 09/16/18 Yes [provider]  Melatonin 3 MG TABS 2 tablets (6 mg total) by Enteral tube: gastric  route every evening. 08/17/18  Yes [provider]  OLANZapine (ZYPREXA) 2.5 MG tablet 1 tablet (2.5 mg total) by G-tube route nightly as needed. 08/17/18  09/16/18 Yes [provider]  ondansetron (ZOFRAN) 4 MG/2ML SOLN injection Infuse 2 mL (4 mg total) into a venous catheter every eight (8) hours as needed. 08/17/18 09/16/18 Yes [provider]  oxyCODONE (ROXICODONE) 5 MG/5ML solution 5 mL (5 mg total) by G-tube route every four (4) hours as needed for up to 5 days. 08/17/18 08/22/18 Yes [provider]  polyethylene glycol (MIRALAX / GLYCOLAX) packet 17 g by G-tube route Two (2) times a day. 08/17/18 09/16/18 Yes [provider]  Psyllium (METAMUCIL FIBER) 51.7 % PACK Take 1 packet by mouth 2 (two) times daily. 08/17/18 09/16/18 Yes [provider]  sodium chloride (ALTAMIST SPRAY) 0.65 % nasal spray 1 spray by Each Nare route every six (6) hours as needed. 08/17/18 08/17/19 Yes [provider]  sodium chloride flush 0.9 % SOLN injection Inject into the vein. Infuse 10 mL into a venous catheter every 8 hours. 08/18/18  Yes [provider]  sodium chloride HYPERTONIC 3 % nebulizer solution Inhale 4 mLs into the lungs 2 (two) times daily. 08/17/18 09/16/18 Yes [provider]  thiamine 100 MG tablet 1 tablet (100 mg total) by G-tube route daily. 08/18/18 09/17/18 Yes [provider]  albuterol (PROVENTIL HFA;VENTOLIN HFA) 108 (90 Base) MCG/ACT inhaler Inhale 2 puffs into the lungs every 6 (six) hours as needed for wheezing or shortness of breath. Patient not taking: Reported on 08/18/2018 11/15/16   Sharyn Creamer, MD  budesonide-formoterol [facility]) 160-4.5 MCG/ACT inhaler Inhale 2 puffs into the lungs 2 (two) times daily.    [provider]  Spacer/Aero-Holding Chambers (AEROCHAMBER PLUS) inhaler Use as instructed Patient not taking: Reported on 08/18/2018 11/15/16   Sharyn Creamer, MD    Allergies: Allergies  Allergen Reactions  . Other     Dog and cat dander intermittently    Social History:  reports that he has quit smoking. He has never used smokeless tobacco. He reports  current alcohol use. No history on file for drug.   Family History: History reviewed. No pertinent family history.  Review of Systems: Review of Systems  Constitutional: Negative for chills and fever.  HENT: Negative for hearing loss.   Respiratory: Negative for shortness of breath.   Cardiovascular: Negative for chest pain.  Gastrointestinal: Positive for abdominal pain. Negative for constipation, diarrhea, nausea and vomiting.  Genitourinary: Negative for dysuria.  Musculoskeletal: Negative for myalgias.  Neurological: Negative for dizziness.  Psychiatric/Behavioral: Negative for depression.    Physical Exam BP 97/66 (BP Location: Left Arm)   Pulse 73   Temp 97.7 F (36.5 C) (Oral)   Resp 16   Ht  (1.727 m)   Wt 50.6 kg   SpO2 94%   BMI 16.96 kg/m  CONSTITUTIONAL: No acute distress HEENT:  Normocephalic, atraumatic, extraocular motion intact. NECK: Trachea is midline, and there is no jugular venous distension. RESPIRATORY:  Lungs are clear, and breath sounds are equal bilaterally. Normal respiratory effort without pathologic use of accessory muscles. CARDIOVASCULAR: Heart is regular without murmurs, gallops, or rubs. GI: The abdomen is of, nondistended, with only some tenderness to palpation directly at the tube site  itself with the sutures.  There is no other areas of significant pain.  The tube is in place with sutures securing the disc in a tack suture going through the abdominal wall.  No drainage or evidence of infection.  No erythema or induration. MUSCULOSKELETAL:  Normal muscle strength and tone in all four extremities.  No peripheral edema or cyanosis. SKIN: Skin turgor is normal. There are no pathologic skin lesions.  NEUROLOGIC:  Motor and sensation is grossly normal.  Cranial nerves are grossly intact. PSYCH:  Alert and oriented to person, place and time. Affect is normal.  Laboratory Analysis: Results for orders placed or performed during the hospital  encounter of 08/17/18 (from the past 24 hour(s))  CBC with Differential/Platelet     Status: Abnormal   Collection Time: 08/19/18  4:41 PM  Result Value Ref Range   WBC 9.2 4.0 - 10.5 K/uL   RBC 3.75 (L) 4.22 - 5.81 MIL/uL   Hemoglobin 10.7 (L) 13.0 - 17.0 g/dL   HCT 93.8 (L) 18.2 - 99.3 %   MCV 89.1 80.0 - 100.0 fL   MCH 28.5 26.0 - 34.0 pg   MCHC 32.0 30.0 - 36.0 g/dL   RDW 71.6 (H) 96.7 - 89.3 %   Platelets 330 150 - 400 K/uL   nRBC 0.0 0.0 - 0.2 %   Neutrophils Relative % 65 %   Neutro Abs 6.0 1.7 - 7.7 K/uL   Lymphocytes Relative 22 %   Lymphs Abs 2.0 0.7 - 4.0 K/uL   Monocytes Relative 8 %   Monocytes Absolute 0.7 0.1 - 1.0 K/uL   Eosinophils Relative 4 %   Eosinophils Absolute 0.3 0.0 - 0.5 K/uL   Basophils Relative 1 %   Basophils Absolute 0.1 0.0 - 0.1 K/uL   Immature Granulocytes 0 %   Abs Immature Granulocytes 0.04 0.00 - 0.07 K/uL  Comprehensive metabolic panel     Status: Abnormal   Collection Time: 08/19/18  4:41 PM  Result Value Ref Range   Sodium 135 135 - 145 mmol/L   Potassium 4.2 3.5 - 5.1 mmol/L   Chloride 101 98 - 111 mmol/L   CO2 28 22 - 32 mmol/L   Glucose, Bld 108 (H) 70 - 99 mg/dL   BUN 6 6 - 20 mg/dL   Creatinine, Ser 8.10 (L) 0.61 - 1.24 mg/dL   Calcium 9.0 8.9 - 17.5 mg/dL   Total Protein 7.0 6.5 - 8.1 g/dL   Albumin 3.0 (L) 3.5 - 5.0 g/dL   AST 38 15 - 41 U/L   ALT 54 (H) 0 - 44 U/L   Alkaline Phosphatase 59 38 - 126 U/L   Total Bilirubin 0.4 0.3 - 1.2 mg/dL   GFR calc non Af Amer >60 >60 mL/min   GFR calc Af Amer >60 >60 mL/min   Anion gap 6 5 - 15    Imaging: Ct Abdomen Pelvis W Contrast  Result Date: 08/19/2018 CLINICAL DATA:  Generalized abdominal pain. Elevated lipase. Evaluate for pancreatitis. History of recent retropharyngeal abscess with mediastinal extension. EXAM: CT ABDOMEN AND PELVIS WITH CONTRAST TECHNIQUE: Multidetector CT imaging of the abdomen and pelvis was performed using the standard protocol following bolus  administration of intravenous contrast. CONTRAST:  54mL OMNIPAQUE IOHEXOL 300 MG/ML  SOLN COMPARISON:  Chest CT of 07/12/2018. Chest radiograph of 08/18/2018. FINDINGS: Lower chest: Right greater than left base airspace disease. Small right larger than left pleural effusions. Hepatobiliary: Suspected too small to characterize segment 2 liver lesion. Hepatomegaly  at 20.6 cm. Normal gallbladder, without biliary ductal dilatation. Pancreas: The pancreas enhances normally, without peripancreatic edema or duct dilatation. Spleen: Normal in size, without focal abnormality. Adrenals/Urinary Tract: Normal adrenal glands. Normal kidneys, without hydronephrosis. Normal urinary bladder. Stomach/Bowel: Gastrostomy tube, appropriately positioned. Colonic stool burden suggests constipation. Appendix not visualized. Normal small bowel caliber. Vascular/Lymphatic: Aortic and branch vessel atherosclerosis. No abdominopelvic adenopathy. Reproductive: Normal prostate. Other: No significant free fluid. Small volume free intraperitoneal air is identified along the liver edge, including on images 19 and 20 of series 2. Musculoskeletal: No acute osseous abnormality. Degenerate disc disease at the lumbosacral junction. IMPRESSION: 1. Trace free intraperitoneal air, indeterminate. A gastrostomy tube has been placed since 07/12/2018. If this is recently placed, this may explain the free intraperitoneal air. Otherwise, this is suggestive of bowel perforation, but no localizing source is seen. 2. No evidence of pancreatitis. 3.  Possible constipation.  No other explanation for pain. 4. Small bilateral pleural effusions with adjacent atelectasis. These results will be called to the ordering clinician or representative by the Radiologist Assistant, and communication documented in the PACS or zVision Dashboard. Electronically Signed   By: Jeronimo Greaves M.D.   On: 08/19/2018 14:09    Assessment and Plan: This is a 53 y.o. male status post  median sternotomy, debridement of mediastinal abscess, tracheostomy, and laparoscopic gastrostomy tube placement.  I have independently viewed the patient's CT scan from today and given his recent surgery on 3/24 for laparoscopic gastrostomy tube, I do believe that the pneumoperitoneum seen on today's study is residual gas from insufflation from his surgery.  Patient white blood cell count today is normal 9.2 and the pain is strictly localized at the tube site itself.  At this point I do not see any acute surgical need.  His tube feeds can be resumed at previous rate.  Discussed with the patient that he would need to keep that tube in place for at least 6 weeks depending how his swallowing studies will end up doing.  He can follow-up with his surgeons at [facility] for this.  His questions have been answered.  Face-to-face time spent with the patient and care providers was 55 minutes, with more than 50% of the time spent counseling, educating, and coordinating care of the patient.     Howie Ill, MD [facility] Pg:  [phone]

## 2018-08-21 ENCOUNTER — Inpatient Hospital Stay: Payer: Medicaid Other

## 2018-08-21 LAB — URINALYSIS, ROUTINE W REFLEX MICROSCOPIC
Bilirubin Urine: NEGATIVE
Glucose, UA: NEGATIVE mg/dL
Hgb urine dipstick: NEGATIVE
Ketones, ur: NEGATIVE mg/dL
Leukocytes,Ua: NEGATIVE
NITRITE: NEGATIVE
Protein, ur: NEGATIVE mg/dL
Specific Gravity, Urine: 1.01 (ref 1.005–1.030)
pH: 8 (ref 5.0–8.0)

## 2018-08-21 MED ORDER — CARVEDILOL 3.125 MG PO TABS: ORAL_TABLET | ORAL | 0 refills | Status: AC

## 2018-08-21 MED ORDER — ALBUTEROL SULFATE (2.5 MG/3ML) 0.083% IN NEBU: 3.0000 mL | INHALATION_SOLUTION | Freq: Four times a day (QID) | RESPIRATORY_TRACT | 12 refills | Status: AC | PRN

## 2018-08-21 MED ORDER — THIAMINE HCL 100 MG PO TABS: 100.0000 mg | ORAL_TABLET | Freq: Every day | ORAL | 0 refills | Status: AC

## 2018-08-21 MED ORDER — POLYETHYLENE GLYCOL 3350 17 G PO PACK: PACK | ORAL | 0 refills | Status: AC

## 2018-08-21 MED ORDER — SODIUM CHLORIDE 0.9 % IV SOLN
1.0000 g | Freq: Once | INTRAVENOUS | Status: AC
  Administered 2018-08-21: 12:00:00 1000 mg via INTRAVENOUS
  Filled 2018-08-21: qty 1

## 2018-08-21 MED ORDER — OSMOLITE 1.5 CAL PO LIQD
1000.0000 mL | ORAL | Status: DC
  Administered 2018-08-22 – 2018-08-23 (×2): 1000 mL

## 2018-08-21 MED ORDER — BUDESONIDE-FORMOTEROL FUMARATE 160-4.5 MCG/ACT IN AERO: 2.0000 | INHALATION_SPRAY | Freq: Two times a day (BID) | RESPIRATORY_TRACT | 12 refills | Status: AC

## 2018-08-21 MED ORDER — FREE WATER: 50.0000 mL | 0 refills | Status: AC

## 2018-08-21 MED ORDER — OLANZAPINE 2.5 MG PO TABS: 2.5000 mg | ORAL_TABLET | Freq: Every evening | ORAL | 0 refills | Status: AC | PRN

## 2018-08-21 MED ORDER — PSYLLIUM 51.7 % PO PACK: 1.0000 | PACK | Freq: Two times a day (BID) | ORAL | 0 refills | Status: AC

## 2018-08-21 MED ORDER — SODIUM CHLORIDE 0.9 % IV SOLN: 1000.0000 mg | INTRAVENOUS | 0 refills | Status: DC

## 2018-08-21 MED ORDER — AMIODARONE HCL 200 MG PO TABS: ORAL_TABLET | ORAL | 0 refills | Status: AC

## 2018-08-21 MED ORDER — ERTAPENEM IV (FOR PTA / DISCHARGE USE ONLY): 1.0000 g | INTRAVENOUS | 0 refills | Status: DC

## 2018-08-21 MED ORDER — ASCORBIC ACID 250 MG PO TABS: 250.0000 mg | ORAL_TABLET | Freq: Two times a day (BID) | ORAL | 0 refills | Status: AC

## 2018-08-21 MED ORDER — OXYCODONE HCL 5 MG/5ML PO SOLN: ORAL | 0 refills | Status: AC

## 2018-08-21 MED ORDER — JUVEN PO PACK: 1.0000 | PACK | Freq: Two times a day (BID) | ORAL | 1 refills | Status: AC

## 2018-08-21 MED ORDER — ACETAMINOPHEN 650 MG/20.3ML PO SOLN: 20.3000 mL | ORAL | 0 refills | Status: AC | PRN

## 2018-08-21 MED ORDER — OSMOLITE 1.5 CAL PO LIQD: 1000.0000 mL | ORAL | 5 refills | Status: AC

## 2018-08-21 NOTE — TOC Initial Note (Signed)
Transition of Care [facility]) - Initial/Assessment Note    Patient Details  Name: Donald Abbott MRN: [id number] Date of Birth: 1965-08-16  Transition of Care [facility]) CM/SW Contact:    Virgel Manifold, RN Phone Number: 08/21/2018, 2:04 PM  Clinical Narrative:   Patient has been medically cleared to discharge home. Patient has trache, PEG, wound vac, and IV abx. [facility]. Rosey Bath has contacted significant other, Missy [phone]) [phone] to assist with charity care. Mother Fahed Gaffke [phone] also involved. Patient will discharge to Missy's home which is [address], [place], [place] [address]. Ordered all DME from [facility] with [employer]. DME includes wound vac, hospital bed, tube feeds and the supplies, trache supplies. Nida Boatman will update TOC team once these things are in place and we can work on scheduling delivery then. Operating right now under the assumption this would be charity DME as well however the patient began Medicaid application on 3/23 and has a phone interview scheduled for 4/15. I have contacted financial counseling to obtain the medicaid pending number for [employer]. Pam with [employer] infusion also on board to assist with IV abx which are ordered until 4/4. RNCM spoke to both [place] and [place] multiple times each regarding disposition. They both express anxiety and frustration about the situation and wish rehab were an option since they do not feel qualified to manage all of this medical equipment. We have gotten approval from the nursing supervisor for Missy to come into the hospital and observe some of the medical care at the bedside. She plans to come in tomorrow once she is off work sometime after lunch.                 Expected Discharge Plan: Home w Home Health Services Barriers to Discharge: Equipment Delay   Patient Goals and CMS Choice   CMS Medicare.gov Compare Post Acute Care list provided to:: Patient Represenative (must comment)(girlfriend,  Missy) Choice offered to / list presented to : NA  Expected Discharge Plan and Services Expected Discharge Plan: Home w Home Health Services   Discharge Planning Services: CM Consult Post Acute Care Choice: Durable Medical Equipment, Home Health Living arrangements for the past 2 months: Single Family Home Expected Discharge Date: 08/21/18               DME Arranged: Tube feeding pump, Trach supplies, Oxygen, IV pump/equipment, Tube feeding, Hospital bed, Negative pressure wound device DME Agency: [employer] HH Arranged: RN, PT, Nurse's Aide, OT, Respirator Therapy, Speech Therapy, IV Antibiotics, Social Work [employer] Agency: [facility] [employer]  Prior Living Arrangements/Services Living arrangements for the past 2 months: Single Family Home Lives with:: Significant Other Patient language and need for interpreter reviewed:: No        Need for Family Participation in Patient Care: Yes (Comment)(sig other Missy to assist with PEG, trache, dressing changes, etc) Care giver support system in place?: Yes (comment)   Criminal Activity/Legal Involvement Pertinent to Current Situation/Hospitalization: No - Comment as needed  Activities of Daily Living Home Assistive Devices/Equipment: None ADL Screening (condition at time of admission) Patient's cognitive ability adequate to safely complete daily activities?: Yes Is the patient deaf or have difficulty hearing?: No Does the patient have difficulty seeing, even when wearing glasses/contacts?: No Does the patient have difficulty concentrating, remembering, or making decisions?: Yes Patient able to express need for assistance with ADLs?: Yes Does the patient have difficulty dressing or bathing?: No Independently performs  ADLs?: Yes (appropriate for developmental age) Does the patient have difficulty walking or climbing stairs?: No Weakness of Legs: Both Weakness of Arms/Hands: Both  Permission  Sought/Granted Permission sought to share information with : Case Manager, Family Supports                Emotional Assessment Appearance:: Developmentally appropriate     Orientation: : Oriented to Self, Oriented to Place, Oriented to  Time, Oriented to Situation      Admission diagnosis:  RETROPHARYNGEAL AND PARAPHARYNGEAL ABSCESS ABESCESS OF MEDIASTINUM Patient Active Problem List   Diagnosis Date Noted  . Gastrostomy tube in place [facility])   . Protein-calorie malnutrition, severe 08/19/2018  . Pressure injury of skin 08/19/2018  . Retropharyngeal abscess 08/18/2018  . Mediastinitis    PCP:  Patient, No Pcp Per Pharmacy:   [facility] [address] [place] - [address] [place] [address] Phone: [phone] Fax: [phone]     Social Determinants of Health (SDOH) Interventions    Readmission Risk Interventions No flowsheet data found.

## 2018-08-21 NOTE — Consult Note (Addendum)
WOC Nurse wound follow-up consult note Vac dressing change performed to midline sternal full thickness post-op dressing. Wound is beefy red with visible wires and some patchy areas of yellow slough surrounding the wire locations.  No odor, small amt yellow drainage in the cannister. Dressing procedure/placement/frequency: Pt was medicated prior to the procedure and tolerated with mod amt discomfort.  One piece black foam applied to  50 mmHg cont pressure.   WOC team will continue to change dressing Q M/W/F. Cammie Mcgee MSN, RN, CWOCN, [place], CNS [phone]

## 2018-08-21 NOTE — Plan of Care (Signed)

## 2018-08-21 NOTE — Progress Notes (Signed)
Notified Dr Juliene Pina, Mom and girl friend missy would like a update from MD

## 2018-08-21 NOTE — Evaluation (Addendum)
Objective Swallowing Evaluation: Type of Study: MBS-Modified Barium Swallow Study   Patient Details  Name: Donald Abbott MRN: [id number] Date of Birth: June 03, 1965  Today's Date: 08/21/2018 Time: SLP Start Time (ACUTE ONLY): 0930 -SLP Stop Time (ACUTE ONLY): 1030  SLP Time Calculation (min) (ACUTE ONLY): 60 min   Past Medical History:  Past Medical History:  Diagnosis Date  . COPD (chronic obstructive pulmonary disease) (HCC)    Past Surgical History: History reviewed. No pertinent surgical history. HPI: Pt is a 53 y/o male w/ PMHx COPD, substance abuse, and prior MRSA neck infection. 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 His stay at [facility] was ~6 weeks d/t medical issues. A MBSS was performed on 08/10/2018 which indicated aspiration of all consistencies. 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 Pt continues w/ wound vac; wound care. Pt is on trach collar for O2 support but unable to utilize the PMV for verbal communication d/t vocal cord issues.     Subjective: pt awake, alert. Communicating via gestures, mouthing, writing.     Assessment / Plan / Recommendation  CHL IP CLINICAL IMPRESSIONS 08/21/2018  Clinical Impression Pt appears to present w/ Profound Pharyngeal phase Dysphagia w/ HIGH risk for Aspiration thus impact on Pulmonary status. Due to the presentation of the swallow during the initial TSP bolus trial, NO further po trials were attempted d/t degree of pharyngeal phase dysphagia and concern for aspiration. Pt has a tracheostomy and PEG placedment baseline d/t recent medical issues; lengthy medical illness. During this MBSS today assessing swallowing function, pt was not wearing  a PMV on the trach d/t the issue of vocal cord(s) paralysis - this needs to be further determined and clarified by ENT d/t conflicting reports if bilateral vs unilateral.  Pt presented w/ Pharyngeal phase dysphagia c/b Severely decreased laryngeal excursion and elevation apparent - much reduced hyolaryngeal movement noted; no epiglottic inversion noted. Also noted NO BOT contact to the pharyngeal wall until at the level of the valleculae. Appropriate palateal lift/seal noted. The reduced hyolaryngeal movement and pharyngeal pressure resulted in bolus material being Aspirated during the swallow attempt; bolus material collecting in the valleculae and pyriform sinuses pharyngeal area; slight amounts of bolus material clearing the UES during repeated dry swallow attempts. Reduced UES relaxation prevented bolus material from entering the Esophagus thus material fell into the laryngeal vestibule as Penetration followed by Aspiration. Pt exhibited a delayed cough response; some Silent aspiration occurring - d/t the paralysis of the vocal cord(s) and not wearing the PMV(on the trach), the cough was ineffective to clear Penetration and Aspiration. Initiation of the pharyngeal swallow was delayed. During the Oral phase, pt exhibited adequate bolus control, A-P transfer, and oral clearing of a TSP trial of thin liquids. No other trials were assessed during this study however. During the Esophageal phase, a full assessment could not be made based on 1 TSP trial, though, it appeared, bolus material did not readily/freely move into the Esophagus d/t restriction of UES opening/relaxation; maybe timing as well.    Results/recommendations of this exam were discussed w/ pt; recomendations to remain NPO w/ initiation of Dysphagia therapy including pharyngeal swallowing exercises and a repeat MBSS in ~4-5 weeks. Recommend frequent oral care for hygiene and stimulation of swallowing. Recommend  possible f/u w/ ENT for direct view of  appearance and physiology of pt's BOT, pharynx, as well as larynx/vocal cords to assess movement; phonation. Recommendations posted in room; handouts given to pt. NSG/MD/CM updated.  Of note, the results of the swallow study today are similar to the results of the swallow study completed at [facility] ~10 days ago.   SLP Visit Diagnosis Dysphagia, pharyngeal phase (R13.13)  Attention and concentration deficit following --  Frontal lobe and executive function deficit following --  Impact on safety and function Severe aspiration risk;Risk for inadequate nutrition/hydration      CHL IP TREATMENT RECOMMENDATION 08/21/2018  Treatment Recommendations Defer treatment plan to f/u with SLP     Prognosis 08/21/2018  Prognosis for Safe Diet Advancement Guarded  Barriers to Reach Goals Severity of deficits;Time post onset  Barriers/Prognosis Comment --    CHL IP DIET RECOMMENDATION 08/21/2018  SLP Diet Recommendations NPO;Alternative means - long-term  Liquid Administration via --  Medication Administration Via alternative means  Compensations --  Postural Changes --      CHL IP OTHER RECOMMENDATIONS 08/21/2018  Recommended Consults (No Data)  Oral Care Recommendations Oral care QID;Patient independent with oral care  Other Recommendations --      CHL IP FOLLOW UP RECOMMENDATIONS 08/21/2018  Follow up Recommendations Home health SLP      CHL IP FREQUENCY AND DURATION 08/21/2018  Speech Therapy Frequency (ACUTE ONLY) (No Data)  Treatment Duration (No Data)           CHL IP ORAL PHASE 08/21/2018  Oral Phase WFL  Oral - Pudding Teaspoon --  Oral - Pudding Cup --  Oral - Honey Teaspoon --  Oral - Honey Cup --  Oral - Nectar Teaspoon --  Oral - Nectar Cup --  Oral - Nectar Straw --  Oral - Thin Teaspoon 1 trial  Oral - Thin Cup --  Oral - Thin Straw --  Oral - Puree --  Oral - Mech Soft --  Oral - Regular --  Oral - Multi-Consistency --  Oral - Pill --  Oral Phase - Comment --    CHL IP  PHARYNGEAL PHASE 08/21/2018  Pharyngeal Phase Impaired  Pharyngeal- Pudding Teaspoon --  Pharyngeal --  Pharyngeal- Pudding Cup --  Pharyngeal --  Pharyngeal- Honey Teaspoon --  Pharyngeal --  Pharyngeal- Honey Cup --  Pharyngeal --  Pharyngeal- Nectar Teaspoon --  Pharyngeal --  Pharyngeal- Nectar Cup --  Pharyngeal --  Pharyngeal- Nectar Straw --  Pharyngeal --  Pharyngeal- Thin Teaspoon 1 trial  Pharyngeal --  Pharyngeal- Thin Cup --  Pharyngeal --  Pharyngeal- Thin Straw --  Pharyngeal --  Pharyngeal- Puree --  Pharyngeal --  Pharyngeal- Mechanical Soft --  Pharyngeal --  Pharyngeal- Regular --  Pharyngeal --  Pharyngeal- Multi-consistency --  Pharyngeal --  Pharyngeal- Pill --  Pharyngeal --  Pharyngeal Comment decreased laryngeal excursion and elevation apparent - much reduced movement. Minimal hyolaryngeal movement noted; no epiglottic inversion noted. Also noted NO BOT contact to the pharyngeal wall until at the level of the valleculae. Appropriate palateal lift/seal noted. The reduced hyolaryngeal movement and pharyngeal pressure resulted in bolus material being Aspirated during the swallow attempt; bolus material collecting in the valleculae and pyriform sinuses pharyngeal area; slight amounts of bolus material clearing the UES during repeated dry swallow attempts. Reduced UES relaxation prevented bolus material from entering the Esophagus thus material fell into the laryngeal vestibule as Penetration followed by Aspiration. Pt exhibited a delayed  cough response - d/t the paralysis of the vocal cord(s) and not wearing the PMV(on the trach), the cough was ineffective to clear Penetration and Aspiration.      CHL IP CERVICAL ESOPHAGEAL PHASE 08/21/2018  Cervical Esophageal Phase (No Data)  Pudding Teaspoon --  Pudding Cup --  Honey Teaspoon --  Honey Cup --  Nectar Teaspoon --  Nectar Cup --  Nectar Straw --  Thin Teaspoon --  Thin Cup --  Thin Straw --  Puree --   Mechanical Soft --  Regular --  Multi-consistency --  Pill --  Cervical Esophageal Comment --       [name], MS, CCC-SLP Sharilynn Cassity 08/21/2018, 3:02 PM

## 2018-08-21 NOTE — Discharge Summary (Addendum)
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 COPD (chronic obstructive pulmonary disease) [facility])     HOSPITAL COURSE:   53 year old male with history of COPD who initially presented to [facility] February 19 with difficulty breathing and was diagnosed with retropharyngeal abscess with mediastinal extension and therefore was transferred to [facility] and had a prolonged hospitalization and now comes back to [facility] for continued treatment.  1.  Retropharyngeal abscess extending into mediastinal: Patient has been evaluated by ENT, cardiothoracic surgery and ID. At discharge he will be on ertapenem and fluconozole through 08/26/2018.  ENT is recommending that patient have outpatient follow-up as already planned on Dr. Ihor Austin April 15 and Dr. Manuella Ghazi April 21. Continue tracheostomy care. Did not pass modified barium patient to be NPO only tube feeds.  2.  Abdominal pain: CT the abdomen was concerning for some free air and therefore I consulted surgery. Patient has had pneumoperitoneum seen on previous CT scan from March 24 and surgery feels that this is residual gas from insufflation from his surgery. There is no acute surgical need. Patient will need keep tube in place for at least 6 weeks.  He will follow-up with the surgeons at [facility].   3.  Wound VAC:  Wound VAC change today and will need to be changed Monday, Wednesday and Friday. He  will follow-up with [facility] surgery  4. NUTRITION:Tube feeds as per diatary.    DISCHARGE CONDITIONS AND DIET:   Stable condition on tube feeds  CONSULTS OBTAINED:  Treatment Team:  Arta Silence, MD  DRUG ALLERGIES:   Allergies  Allergen Reactions  . Other     Dog and cat dander intermittently    DISCHARGE MEDICATIONS:   Allergies as of 08/24/2018      Reactions   Other    Dog and cat dander intermittently      Medication List    STOP taking these medications   AeroChamber Plus inhaler   Altamist Spray 0.65 % nasal spray Generic drug:  sodium chloride   arformoterol 15 MCG/2ML Nebu Commonly known as:  BROVANA   budesonide 0.25 MG/2ML nebulizer solution Commonly known as:  PULMICORT   chlorhexidine 0.12 % solution Commonly known as:  PERIDEX   ertapenem 500 mg in sodium chloride 0.9 % 50 mL   Melatonin 3 MG Tabs   sodium chloride HYPERTONIC 3 % nebulizer solution     TAKE these medications   Acetaminophen 650 MG/20.3ML Soln 20.3 mLs (650 mg total) by Enteral route every 4 (four) hours as needed.   albuterol (2.5 MG/3ML) 0.083% nebulizer solution Commonly known as:  PROVENTIL Inhale 3 mLs into the lungs every 6 (six) hours as needed for wheezing or shortness of breath. What changed:  Another medication with the same name was removed. Continue taking this medication, and follow the directions you see here.   amiodarone 200 MG tablet Commonly known as:  PACERONE 1 tablet (200 mg total) by G-tube route  daily.   ascorbic acid 250 MG tablet Commonly known as:  VITAMIN C Place 1 tablet (250 mg total) into feeding tube 2 (two) times daily.   aspirin 81 MG chewable tablet 1 tablet (81 mg total) by G-tube route daily.   budesonide-formoterol 160-4.5 MCG/ACT inhaler Commonly known as:  Symbicort Inhale 2 puffs into the lungs 2 (two) times daily.   carvedilol 3.125 MG tablet Commonly known as:  COREG 1 tablet (3.125 mg total) by G-tube route Two (2)  times a day.   ertapenem  IVPB Commonly known as:  INVANZ Inject 1 g into the vein daily for 3 days. Indication:  Retropharyngeal and mediastinal abscess,  Culture Result: strep aniginosus, propinobacterium Last Day of Therapy:  08/26/2018 Labs - Once weekly:  CBC/D and BMP, Labs - Every other week:  ESR and CRP   feeding supplement (OSMOLITE 1.5 CAL) Liqd Place 1,000 mLs into feeding tube continuous for 30 days.   nutrition supplement (JUVEN) Pack Place 1 packet into feeding tube 2 (two) times daily between meals.   fluconazole 200 MG tablet Commonly known as:  DIFLUCAN 2 tablets (400 mg total) by G-tube route daily for 8 days.   free water Soln Place 50 mLs into feeding tube every 4 (four) hours.   hydrALAZINE 20 MG/ML injection Commonly known as:  APRESOLINE Inject 1 mL into the vein every 4 (four) hours as needed. (Give for SBP>160 after Labetalol)   labetalol 5 MG/ML injection Commonly known as:  NORMODYNE,TRANDATE Inject 4 mLs into the vein every 4 (four) hours as needed. (Give for SBP>160 or HR>100)   loperamide 2 MG capsule Commonly known as:  IMODIUM Take 1 capsule by mouth 2 (two) times daily as needed.   OLANZapine 2.5 MG tablet Commonly known as:  ZYPREXA 1 tablet (2.5 mg total) by G-tube route nightly as needed. What changed:  Another medication with the same name was added. Make sure you understand how and when to take each.   OLANZapine 2.5 MG tablet Commonly known as:  ZYPREXA Place 1 tablet (2.5 mg total) into feeding tube at bedtime as needed (Insomnia). What changed:  You were already taking a medication with the same name, and this prescription was added. Make sure you understand how and when to take each.   ondansetron 4 MG/2ML Soln injection Commonly known as:  ZOFRAN Infuse 2 mL (4 mg total) into a venous catheter every eight (8) hours as needed.   oxyCODONE 5 MG/5ML solution Commonly known as:  ROXICODONE 5 mL (5 mg total) by G-tube route every  four (4) hours as needed for up to 5 days.   polyethylene glycol packet Commonly known as:  MIRALAX / GLYCOLAX 17 g by G-tube route Two (2) times a day.   Psyllium 51.7 % Pack Commonly known as:  Metamucil Fiber Take 1 packet by mouth 2 (two) times daily for 30 days.   sodium chloride flush 0.9 % Soln injection Inject into the vein. Infuse 10 mL into a venous catheter every 8 hours.   thiamine 100 MG tablet Place 1 tablet (100 mg total) into feeding tube daily. What changed:  See the new instructions.            Home Infusion Instuctions  (From admission, onward)         Start     Ordered   08/21/18 0000  Home infusion instructions [facility] May follow [place] Dosing Protocol; May administer Cathflo as needed to maintain patency  of vascular access device.; Flushing of vascular access device: per [facility] Protocol: 0.9% NaCl pre/post medica...    Question Answer Comment  Instructions May follow [place] Dosing Protocol   Instructions May administer Cathflo as needed to maintain patency of vascular access device.   Instructions Flushing of vascular access device: per [facility] Protocol: 0.9% NaCl pre/post medication administration and prn patency; Heparin 100 u/ml, 50m for implanted ports and Heparin 10u/ml, 576mfor all other central venous catheters.   Instructions May follow [facility] Anaphylaxis Protocol for First Dose Administration in the home: 0.9% NaCl at 25-50 ml/hr to maintain IV access for protocol meds. Epinephrine 0.3 ml IV/IM PRN and Benadryl 25-50 IV/IM PRN s/s of anaphylaxis.   Instructions [facility] Infusion Coordinator (RN) to assist per patient IV care needs in the home PRN.      08/21/18 1129           Durable Medical Equipment  (From admission, onward)         Start     Ordered   08/24/18 0759  For home use only DME oxygen  Once    Question Answer Comment  Mode or (Route) Nasal cannula   Liters per Minute 3   Frequency Continuous  (stationary and portable oxygen unit needed)   Oxygen conserving device Yes   Oxygen delivery system Gas      08/24/18 0758   08/23/18 1036  For home use only DME Nebulizer machine  Once    Question:  Patient needs a nebulizer to treat with the following condition  Answer:  COPD (chronic obstructive pulmonary disease) ([place]  08/23/18 1035   08/22/18 1421  For home use only DME Trach supplies  Once    Comments:  brlsh or pipe cleaners, suction with connection tubing, trach care kits or clean  non shredding 4x4s, trach ties or securement device  Question Answer Comment  Trach Type Shiley   Size 6   Cuffed or Uncuffed Uncuffed   Does patient need speaking valve? Yes   Trach Humidification Yes   Suction Needed? Yes      08/22/18 1433   08/21/18 1211  For home use only DME Negative pressure wound device  Once    Comments:  Rate of 5021mg  Question Answer Comment  Frequency of dressing change 3 times per week   Length of need 6 Months   Dressing type Foam   Amount of suction Other(see comments)   Pressure application Continuous pressure   Supplies 10 canisters and 15 dressings per month for duration of therapy      08/21/18 1210   08/21/18 1206  For home use only DME Tube feeding pump  Once     08/21/18 1207   08/21/18 1206  For home use only DME Hospital bed  Once    Question Answer Comment  Patient has (list medical condition): COPD   The above medical condition requires: Patient requires the ability to reposition immediately   Head must be elevated greater than: 45 degrees   Bed type Semi-electric   Hoyer Lift Yes   Support Surface: Gel Overlay      08/21/18 1207   08/21/18 1205  For home use only DME Tube feeding  Once     08/21/18 1207            Today   CHIEF COMPLAINT:  No acute issues overnight abdominal pain seems to have improved   VITAL SIGNS:  Blood pressure 97/62, pulse  76, temperature 98.3 F (36.8 C), temperature source Oral, resp. rate 17, height  '5\' 8"'$  (1.727 m), weight 50.6 kg, SpO2 97 %.   REVIEW OF SYSTEMS:  Review of Systems  Constitutional: Negative.  Negative for chills, fever and malaise/fatigue.  HENT: Negative.  Negative for ear discharge, ear pain, hearing loss, nosebleeds and sore throat.   Eyes: Negative.  Negative for blurred vision and pain.  Respiratory: Negative.  Negative for cough, hemoptysis, shortness of breath and wheezing.   Cardiovascular: Negative.  Negative for chest pain, palpitations and leg swelling.  Gastrointestinal: Negative.  Negative for abdominal pain, blood in stool, diarrhea, nausea and vomiting.  Genitourinary: Negative.  Negative for dysuria.  Musculoskeletal: Negative.  Negative for back pain.  Skin: Negative.   Neurological: Negative for dizziness, tremors, speech change, focal weakness, seizures and headaches.  Endo/Heme/Allergies: Negative.  Does not bruise/bleed easily.  Psychiatric/Behavioral: Negative.  Negative for depression, hallucinations and suicidal ideas.     PHYSICAL EXAMINATION:  GENERAL:  54 y.o.-year-old patient lying in the bed with no acute distress.  NECK:  Supple, no jugular venous distention. No thyroid enlargement, no tenderness.  trach LUNGS: Normal breath sounds bilaterally, no wheezing, rales,rhonchi  No use of accessory muscles of respiration.  CARDIOVASCULAR: S1, S2 normal. No murmurs, rubs, or gallops.  ABDOMEN: Soft, non-tender, non-distended. Bowel sounds present. No organomegaly or mass.  PEG EXTREMITIES: No pedal edema, cyanosis, or clubbing.  PSYCHIATRIC: The patient is alert and oriented x 3.  SKIN: No obvious rash, lesion, or ulcer.   DATA REVIEW:   CBC Recent Labs  Lab 08/19/18 1641  WBC 9.2  HGB 10.7*  HCT 33.4*  PLT 330    Chemistries  Recent Labs  Lab 08/18/18 0237 08/19/18 1641  NA 137 135  K 3.8 4.2  CL 96* 101  CO2 32 28  GLUCOSE 117* 108*  BUN 11 6  CREATININE 0.51* 0.52*  CALCIUM 8.7* 9.0  MG 1.7  --   AST 52* 38   ALT 68* 54*  ALKPHOS 67 59  BILITOT 0.5 0.4    Cardiac Enzymes Recent Labs  Lab 08/18/18 0243  TROPONINI <0.03    Microbiology Results  '@MICRORSLT48'$ @  RADIOLOGY:  No results found.    Allergies as of 08/24/2018      Reactions   Other    Dog and cat dander intermittently      Medication List    STOP taking these medications   AeroChamber Plus inhaler   Altamist Spray 0.65 % nasal spray Generic drug:  sodium chloride   arformoterol 15 MCG/2ML Nebu Commonly known as:  BROVANA   budesonide 0.25 MG/2ML nebulizer solution Commonly known as:  PULMICORT   chlorhexidine 0.12 % solution Commonly known as:  PERIDEX   ertapenem 500 mg in sodium chloride 0.9 % 50 mL   Melatonin 3 MG Tabs   sodium chloride HYPERTONIC 3 % nebulizer solution     TAKE these medications   Acetaminophen 650 MG/20.3ML Soln 20.3 mLs (650 mg total) by Enteral route every 4 (four) hours as needed.   albuterol (2.5 MG/3ML) 0.083% nebulizer solution Commonly known as:  PROVENTIL Inhale 3 mLs into the lungs every 6 (six) hours as needed for wheezing or shortness of breath. What changed:  Another medication with the same name was removed. Continue taking this medication, and follow the directions you see here.   amiodarone 200 MG tablet Commonly known as:  PACERONE 1 tablet (200 mg total) by G-tube route daily.  ascorbic acid 250 MG tablet Commonly known as:  VITAMIN C Place 1 tablet (250 mg total) into feeding tube 2 (two) times daily.   aspirin 81 MG chewable tablet 1 tablet (81 mg total) by G-tube route daily.   budesonide-formoterol 160-4.5 MCG/ACT inhaler Commonly known as:  Symbicort Inhale 2 puffs into the lungs 2 (two) times daily.   carvedilol 3.125 MG tablet Commonly known as:  COREG 1 tablet (3.125 mg total) by G-tube route Two (2) times a day.   ertapenem  IVPB Commonly known as:  INVANZ Inject 1 g into the vein daily for 3 days. Indication:  Retropharyngeal and  mediastinal abscess,  Culture Result: strep aniginosus, propinobacterium Last Day of Therapy:  08/26/2018 Labs - Once weekly:  CBC/D and BMP, Labs - Every other week:  ESR and CRP   feeding supplement (OSMOLITE 1.5 CAL) Liqd Place 1,000 mLs into feeding tube continuous for 30 days.   nutrition supplement (JUVEN) Pack Place 1 packet into feeding tube 2 (two) times daily between meals.   fluconazole 200 MG tablet Commonly known as:  DIFLUCAN 2 tablets (400 mg total) by G-tube route daily for 8 days.   free water Soln Place 50 mLs into feeding tube every 4 (four) hours.   hydrALAZINE 20 MG/ML injection Commonly known as:  APRESOLINE Inject 1 mL into the vein every 4 (four) hours as needed. (Give for SBP>160 after Labetalol)   labetalol 5 MG/ML injection Commonly known as:  NORMODYNE,TRANDATE Inject 4 mLs into the vein every 4 (four) hours as needed. (Give for SBP>160 or HR>100)   loperamide 2 MG capsule Commonly known as:  IMODIUM Take 1 capsule by mouth 2 (two) times daily as needed.   OLANZapine 2.5 MG tablet Commonly known as:  ZYPREXA 1 tablet (2.5 mg total) by G-tube route nightly as needed. What changed:  Another medication with the same name was added. Make sure you understand how and when to take each.   OLANZapine 2.5 MG tablet Commonly known as:  ZYPREXA Place 1 tablet (2.5 mg total) into feeding tube at bedtime as needed (Insomnia). What changed:  You were already taking a medication with the same name, and this prescription was added. Make sure you understand how and when to take each.   ondansetron 4 MG/2ML Soln injection Commonly known as:  ZOFRAN Infuse 2 mL (4 mg total) into a venous catheter every eight (8) hours as needed.   oxyCODONE 5 MG/5ML solution Commonly known as:  ROXICODONE 5 mL (5 mg total) by G-tube route every four (4) hours as needed for up to 5 days.   polyethylene glycol packet Commonly known as:  MIRALAX / GLYCOLAX 17 g by G-tube route  Two (2) times a day.   Psyllium 51.7 % Pack Commonly known as:  Metamucil Fiber Take 1 packet by mouth 2 (two) times daily for 30 days.   sodium chloride flush 0.9 % Soln injection Inject into the vein. Infuse 10 mL into a venous catheter every 8 hours.   thiamine 100 MG tablet Place 1 tablet (100 mg total) into feeding tube daily. What changed:  See the new instructions.            Home Infusion Instuctions  (From admission, onward)         Start     Ordered   08/21/18 0000  Home infusion instructions [facility] May follow [place] Dosing Protocol; May administer Cathflo as needed to maintain patency of vascular access  device.; Flushing of vascular access device: per [facility] Protocol: 0.9% NaCl pre/post medica...    Question Answer Comment  Instructions May follow [place] Dosing Protocol   Instructions May administer Cathflo as needed to maintain patency of vascular access device.   Instructions Flushing of vascular access device: per [facility] Protocol: 0.9% NaCl pre/post medication administration and prn patency; Heparin 100 u/ml, 84m for implanted ports and Heparin 10u/ml, 587mfor all other central venous catheters.   Instructions May follow [facility] Anaphylaxis Protocol for First Dose Administration in the home: 0.9% NaCl at 25-50 ml/hr to maintain IV access for protocol meds. Epinephrine 0.3 ml IV/IM PRN and Benadryl 25-50 IV/IM PRN s/s of anaphylaxis.   Instructions [facility] Infusion Coordinator (RN) to assist per patient IV care needs in the home PRN.      08/21/18 1129           Durable Medical Equipment  (From admission, onward)         Start     Ordered   08/24/18 0759  For home use only DME oxygen  Once    Question Answer Comment  Mode or (Route) Nasal cannula   Liters per Minute 3   Frequency Continuous (stationary and portable oxygen unit needed)   Oxygen conserving device Yes   Oxygen delivery system Gas      08/24/18 0758   08/23/18  1036  For home use only DME Nebulizer machine  Once    Question:  Patient needs a nebulizer to treat with the following condition  Answer:  [place]D (chronic obstructive pulmonary disease) [place]  08/23/18 1035   08/22/18 1421  For home use only DME Trach supplies  Once    Comments:  brlsh or pipe cleaners, suction with connection tubing, trach care kits or clean  non shredding 4x4s, trach ties or securement device  Question Answer Comment  Trach Type Shiley   Size 6   Cuffed or Uncuffed Uncuffed   Does patient need speaking valve? Yes   Trach Humidification Yes   Suction Needed? Yes      08/22/18 1433   08/21/18 1211  For home use only DME Negative pressure wound device  Once    Comments:  Rate of 5067mg  Question Answer Comment  Frequency of dressing change 3 times per week   Length of need 6 Months   Dressing type Foam   Amount of suction Other(see comments)   Pressure application Continuous pressure   Supplies 10 canisters and 15 dressings per month for duration of therapy      08/21/18 1210   08/21/18 1206  For home use only DME Tube feeding pump  Once     08/21/18 1207   08/21/18 1206  For home use only DME Hospital bed  Once    Question Answer Comment  Patient has (list medical condition): COPD   The above medical condition requires: Patient requires the ability to reposition immediately   Head must be elevated greater than: 45 degrees   Bed type Semi-electric   Hoyer Lift Yes   Support Surface: Gel Overlay      08/21/18 1207   08/21/18 1205  For home use only DME Tube feeding  Once     08/21/18 1207            Management plans discussed with the patient and he is in agreement. [facility]  Code Status Orders  (From admission, onward)         Start     Ordered   08/18/18 0108  Full code  Continuous     08/18/18 0109        Code Status History    This patient has a current  code status but no historical code status.      TOTAL TIME TAKING CARE OF THIS PATIENT: 38 minutes.    Note: This dictation was prepared with Dragon dictation along with smaller phrase technology. Any transcriptional errors that result from this process are unintentional.  Bettey Costa M.D on 08/24/2018 at 9:14 AM  Between 7am to 6pm - Pager - [phone] After 6pm go to [email or web address] - password [email or web address] [facility]  Office  [phone]  CC: Primary care physician; Theotis Burrow, MD

## 2018-08-21 NOTE — Progress Notes (Signed)
Brief Progress Note  Discussed patient and reviewed chart with Dr Hulda Marin, MD.  It appears [facility] RN is changing wound vac as scheduled MWF. We appreciate their assistance with this.  At this time, no identified cardiothoracic surgical needs.  Please re-consult cardiothoracic surgery if new questions or issues were to arise.   Lynden Oxford, PA-C [facility] 08/21/2018, 1:03 PM [phone] M-F: 7am - 4pm

## 2018-08-21 NOTE — Consult Note (Signed)
PHARMACY CONSULT NOTE FOR:  OUTPATIENT  PARENTERAL ANTIBIOTIC THERAPY (OPAT)  Indication: Retropharyngeal and mediastinal abscess, b/l empyema  Culture Result: strep aniginosus, propinobacterium Regimen: Ertapenem 1g IV every 24 hours End date:  08/26/2018   IV antibiotic discharge orders are pended. To discharging provider:  please sign these orders via discharge navigator,  Select New Orders & click on the button choice - Manage This Unsigned Work.     Thank you for allowing pharmacy to be a part of this patient's care.  Gardner Candle, PharmD, BCPS Clinical Pharmacist 08/21/2018 11:23 AM

## 2018-08-21 NOTE — Treatment Plan (Signed)
Diagnosis: Retropharyngeal and mediastinal abscess, b/l empyema all drained Baseline Creatinine: 0.52   Culture Result: strep aniginosus, propinobacterium, candida albicans  Allergies  Allergen Reactions  . Other     Dog and cat dander intermittently    OPAT Orders Discharge antibiotics:  Ertapenem 1 gram IV Q 24 hrs until 08/26/18 Fluconazole suspension 400mg  thru PEG once a day until 08/26/18   [facility] Care Per Protocol:  Labs weekly while on IV antibiotics: _X_ CBC with differential  _X_ CMP     _X_ Please pull PIC at completion of IV antibiotics  Fax weekly labs to [phone]  Follow up with thoracic surgery and ENT Call [phone] if any questions or concerns

## 2018-08-22 MED ORDER — SODIUM CHLORIDE 0.9 % IV SOLN
1.0000 g | Freq: Once | INTRAVENOUS | Status: DC
  Filled 2018-08-22: qty 1

## 2018-08-22 MED ORDER — SODIUM CHLORIDE 0.9 % IV SOLN
1.0000 g | INTRAVENOUS | Status: DC
  Administered 2018-08-22 – 2018-08-24 (×3): 1000 mg via INTRAVENOUS
  Filled 2018-08-22 (×4): qty 1

## 2018-08-22 MED ORDER — SODIUM CHLORIDE 0.9 % IV SOLN
INTRAVENOUS | Status: DC | PRN
  Administered 2018-08-22: 10 mL via INTRAVENOUS

## 2018-08-22 NOTE — Progress Notes (Signed)
[facility] - [place] at [facility]   PATIENT NAME: Donald Abbott    MR#:  [id number]  DATE OF BIRTH:  1966/04/27  SUBJECTIVE:   No issues going home today  REVIEW OF SYSTEMS:    Review of Systems  Constitutional: Negative for fever, chills weight loss HENT: Negative for ear pain, nosebleeds, congestion, facial swelling, rhinorrhea, neck pain, neck stiffness and ear discharge.   Respiratory: Negative for cough, shortness of breath, wheezing  Cardiovascular: Negative for chest pain, palpitations and leg swelling.  Gastrointestinal: Negative for heartburn, no vomiting, diarrhea or consitpation Genitourinary: Negative for dysuria, urgency, frequency, hematuria Musculoskeletal: Negative for back pain or joint pain Neurological: Negative for dizziness, seizures, syncope, focal weakness,  numbness and headaches.  Hematological: Does not bruise/bleed easily.  Psychiatric/Behavioral: Negative for hallucinations, confusion, dysphoric mood    Tolerating Diet: Tube feeds    DRUG ALLERGIES:   Allergies  Allergen Reactions  . Other     Dog and cat dander intermittently    VITALS:  Blood pressure 109/70, pulse 78, temperature 98.4 F (36.9 C), temperature source Oral, resp. rate 16, height 5\' 8"  (1.727 m), weight 50.6 kg, SpO2 98 %.  PHYSICAL EXAMINATION:  Constitutional: Appears well-developed and well-nourished. No distress. HENT: Normocephalic. Marland Kitchen Achy ostomy placed   eyes: Conjunctivae and EOM are normal. PERRLA, no scleral icterus.  Neck: Normal ROM. Neck supple. No JVD. No tracheal deviation. CVS: RRR, S1/S2 +, no murmurs, no gallops, no carotid bruit.  Pulmonary: Coarse breath sounds bilaterally with normal respiratory effort Abdominal: Soft. BS +,  no distension,. PEG without signs of infection mild tenderness around PEG tube but no rebound or guarding Musculoskeletal: Normal range of motion. No edema and no tenderness.  Neuro: Alert. CN 2-12 grossly  intact. No focal deficits. Skin: There are 3 chest tube sites inferior to his wound VAC. No chest incisions Psychiatric: Normal mood and affect.      LABORATORY PANEL:   CBC Recent Labs  Lab 08/19/18 1641  WBC 9.2  HGB 10.7*  HCT 33.4*  PLT 330   ------------------------------------------------------------------------------------------------------------------  Chemistries  Recent Labs  Lab 08/18/18 0237 08/19/18 1641  NA 137 135  K 3.8 4.2  CL 96* 101  CO2 32 28  GLUCOSE 117* 108*  BUN 11 6  CREATININE 0.51* 0.52*  CALCIUM 8.7* 9.0  MG 1.7  --   AST 52* 38  ALT 68* 54*  ALKPHOS 67 59  BILITOT 0.5 0.4   ------------------------------------------------------------------------------------------------------------------  Cardiac Enzymes Recent Labs  Lab 08/18/18 0243  TROPONINI <0.03   ------------------------------------------------------------------------------------------------------------------  RADIOLOGY:  No results found.   ASSESSMENT AND PLAN:   53 year old male with history of COPD who initially presented to [facility] February 19 with difficulty breathing and was diagnosed with retropharyngeal abscess with mediastinal extension and therefore was transferred to [facility] and had a prolonged hospitalization and now comes back to [facility] for continued treatment.  1.  Retropharyngeal abscess extending into mediastinal: Patient has been evaluated by ENT, cardiothoracic surgery and ID. ID is recommending Zosyn and fluconazole. At the time of discharge patient will be discharged on ertapenem via PEG. Antibiotics to be continued until April 4,2020. ENT is recommending that patient have outpatient follow-up as already planned on Dr. Roma Schanz April 15 and Dr. Sherryll Burger April 21. Continue tracheostomy care. Did not pass modified barium patient to be NPO only tube feeds.  2.  Abdominal pain: CT the abdomen was concerning for some free air and therefore I consulted  surgery. Patient  has had pneumoperitoneum seen on previous CT scan from March 24 and surgery feels that this is residual gas from insufflation from his surgery. There is no acute surgical need. Patient will need keep tube in place for at least 6 weeks.  He will follow-up with the surgeons at [facility].   3.  Wound VAC: Cardiothoracic surgery is following patient.  Wound VAC change today 4. NUTRITION:Tube feeds changes as per diatary.  management plans discussed with the patient and he is in agreement.    CODE STATUS: full  TOTAL TIME TAKING CARE OF THIS PATIENT: 24 minutes.     POSSIBLE D/C today with HHC, DEPENDING ON CLINICAL CONDITION.   Adrian Saran M.D on 08/21/2018 at 10:12 AM  Between 7am to 6pm - Pager - [phone] After 6pm go to [email or web address] - password [email or web address]  [facility] [place] [facility]  Office  [phone]  CC: Primary care physician; Patient, No Pcp Per  Note: This dictation was prepared with Dragon dictation along with smaller phrase technology. Any transcriptional errors that result from this process are unintentional.

## 2018-08-22 NOTE — Progress Notes (Signed)
  Speech Language Pathology Treatment: Dysphagia  Patient Details Name: Donald Abbott MRN: [id number] DOB: [birth year] Today's Date: 08/22/2018 Time: 1240-1340 SLP Time Calculation (min) (ACUTE ONLY): 60 min  Assessment / Plan / Recommendation Clinical Impression  Pt seen today for Dysphagia tx addressing therapeutic targeting pharyngeal strengthening and lingual strengthening exercises. Pt continues to have a tracheostomy w/ cuffless trach in preparation for d/c home per RT. He does not wear the PMV d/t min discomfort and inability to achieve phonation/verbalizations. 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  During this session today, discussed results and recommendations of MBSS again; the extent of the Aspiration occurring w/ oral intake as noted in these last 2 MBSSs([facility], [facility]). Education was given on dysphagia and the impact from dysphagia if attempting oral intake including pulmonary decline, increased congestion and need for tracheal suctioning. Presented and demonstrated pharyngeal strengthening exs, then lingual exs w/ Handouts provided. Education and model provided w/ each of the 5 exs; pt practiced each ex. Discussed a workout schedule for d/c home and the importance of frequent/daily follow through w/ exs. Discussed importance of frequent oral care to reduce oral bacteria especially if an ice chip protocol becomes introduced in the future. Encouraged pt to continue working w/ SLP through [facility] and to f/u w/ another MBSS in ~4-5 weeks. Encouraged pt to continue his TFs and flushes via PEG for optimal nutrition/hydration for overall healing. Pt agreed.  Recommend f/u w/ ENT for direct viewing of pharynx and larynx as well as VC  movement/function. ST will f/u w/ ongoing training of swallowing exs; breath support strengthening as best possible. NSG, CM updated.     HPI HPI: Pt is a 53 y/o male w/ PMHx COPD, substance abuse, and prior MRSA neck infection. Pt was a transfer from [facility] to [facility] 07/12/2018 for retropharyngeal abscess w/ mediastinal extension/abscess + mediastinitis, multiple washouts now a return transfer from [facility] to [facility] 08/17/2018 for continued long-term management. His stay at [facility] was ~6 weeks d/t medical issues. A MBSS was performed on 08/10/2018 which indicated aspiration of all consistencies. 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 Pt continues w/ wound vac; wound care. Pt is on trach collar for O2 support but unable to utilize the PMV for verbal communication d/t vocal cord issues. A repeat MBSS was completed on 3/30/202 w/ profound pharyngeal phase dysphagia noted; oral phase dysphagia. It was recommended for pt to remain NPO w/ TFs via PEG as primary nutrition/hydration.       SLP Plan  Continue with current plan of care       Recommendations  Diet recommendations: NPO(w/ TFs via PEG) Medication Administration: Via alternative means(PEG)                Oral Care Recommendations: Oral care QID;Patient independent with oral care Follow up Recommendations: Home health SLP SLP Visit Diagnosis: Dysphagia, oropharyngeal phase (R13.12);[place]ph[place] (R49.1) Plan: Continue with current plan of care       GO                Donald Som, MS, CCC-SLP Donald Abbott 08/22/2018, 3:25 PM

## 2018-08-22 NOTE — Progress Notes (Signed)
[facility] - [place] at [facility]   PATIENT NAME: Donald Abbott    MR#:  [id number]  DATE OF BIRTH:  03-09-1966  SUBJECTIVE:   No issues reported overnight   REVIEW OF SYSTEMS:    Review of Systems  Constitutional: Negative for fever, chills weight loss HENT: Negative for ear pain, nosebleeds, congestion, facial swelling, rhinorrhea, neck pain, neck stiffness and ear discharge.   Respiratory: Negative for cough, shortness of breath, wheezing  Cardiovascular: Negative for chest pain, palpitations and leg swelling.  Gastrointestinal: Negative for heartburn, no vomiting, diarrhea or consitpation Genitourinary: Negative for dysuria, urgency, frequency, hematuria Musculoskeletal: Negative for back pain or joint pain Neurological: Negative for dizziness, seizures, syncope, focal weakness,  numbness and headaches.  Hematological: Does not bruise/bleed easily.  Psychiatric/Behavioral: Negative for hallucinations, confusion, dysphoric mood    Tolerating Diet: Tube feeds    DRUG ALLERGIES:   Allergies  Allergen Reactions  . Other     Dog and cat dander intermittently    VITALS:  Blood pressure 101/68, pulse 77, temperature 98.6 F (37 C), temperature source Oral, resp. rate 17, height 5\' 8"  (1.727 m), weight 50.6 kg, SpO2 95 %.  PHYSICAL EXAMINATION:  Constitutional: Appears well-developed and well-nourished. No distress. HENT: Normocephalic. Marland Kitchen Achy ostomy placed   eyes: Conjunctivae and EOM are normal. PERRLA, no scleral icterus.  Neck: Normal ROM. Neck supple. No JVD. No tracheal deviation. CVS: RRR, S1/S2 +, no murmurs, no gallops, no carotid bruit.  Pulmonary: Coarse breath sounds bilaterally with normal respiratory effort Abdominal: Soft. BS +,  no distension,. PEG without signs of infection mild tenderness around PEG tube but no rebound or guarding Musculoskeletal: Normal range of motion. No edema and no tenderness.  Neuro: Alert. CN 2-12 grossly  intact. No focal deficits. Skin: There are 3 chest tube sites inferior to his wound VAC. No chest incisions Psychiatric: Normal mood and affect.      LABORATORY PANEL:   CBC Recent Labs  Lab 08/19/18 1641  WBC 9.2  HGB 10.7*  HCT 33.4*  PLT 330   ------------------------------------------------------------------------------------------------------------------  Chemistries  Recent Labs  Lab 08/18/18 0237 08/19/18 1641  NA 137 135  K 3.8 4.2  CL 96* 101  CO2 32 28  GLUCOSE 117* 108*  BUN 11 6  CREATININE 0.51* 0.52*  CALCIUM 8.7* 9.0  MG 1.7  --   AST 52* 38  ALT 68* 54*  ALKPHOS 67 59  BILITOT 0.5 0.4   ------------------------------------------------------------------------------------------------------------------  Cardiac Enzymes Recent Labs  Lab 08/18/18 0243  TROPONINI <0.03   ------------------------------------------------------------------------------------------------------------------  RADIOLOGY:  No results found.   ASSESSMENT AND PLAN:   53 year old male with history of COPD who initially presented to [facility] February 19 with difficulty breathing and was diagnosed with retropharyngeal abscess with mediastinal extension and therefore was transferred to [facility] and had a prolonged hospitalization and now comes back to [facility] for continued treatment.  1.  Retropharyngeal abscess extending into mediastinal: Patient has been evaluated by ENT, cardiothoracic surgery and ID. ID is recommending Zosyn and fluconazole. At the time of discharge patient will be discharged on ertapenem via PEG. Antibiotics to be continued until April 4,2020. ENT is recommending that patient have outpatient follow-up as already planned on Dr. Roma Schanz April 15 and Dr. Sherryll Burger April 21. Continue tracheostomy care. Did not pass modified barium patient to be NPO only tube feeds.  2.  Abdominal pain: CT the abdomen was concerning for some free air and therefore I consulted  surgery. Patient  has had pneumoperitoneum seen on previous CT scan from March 24 and surgery feels that this is residual gas from insufflation from his surgery. There is no acute surgical need. Patient will need keep tube in place for at least 6 weeks.  He will follow-up with the surgeons at [facility].   3.  Wound VAC: Cardiothoracic surgery is following patient.  Wound VAC change today 4. NUTRITION:Tube feeds changes as per diatary.  management plans discussed with the patient and he is in agreement.  D/w girlfriend yesterday and mom today  CODE STATUS: full  TOTAL TIME TAKING CARE OF THIS PATIENT: 24 minutes.     POSSIBLE D/C today with HHC, DEPENDING ON CLINICAL CONDITION.   Adrian Saran M.D on 08/21/2018 at 12:12 PM  Between 7am to 6pm - Pager - [phone] After 6pm go to [email or web address] - password [email or web address]  [facility] [place] [facility]  Office  [phone]  CC: Primary care physician; Patient, No Pcp Per  Note: This dictation was prepared with Dragon dictation along with smaller phrase technology. Any transcriptional errors that result from this process are unintentional.

## 2018-08-22 NOTE — Care Management (Addendum)
MEDICAL NECESSITY FOR HOSPITAL BED   Patient suffers from  dysphagia and paralyzed vocal cords and is at severe risk for aspiration if  head of bed is elevated less  than 30  degrees.  Patient also with copd and mediastinitis with abscess requiring wound vac. Marland Kitchen  Bed wedges do not provide enough elevation to resolve breathing issues. Shortness of breath cause patient to require frequent changes in body position which cannot be achieved with a normal bed.

## 2018-08-22 NOTE — Progress Notes (Signed)
Demonstrated trache cleaning and suctioning to significant other. Answered any questions she had concerning trache care.

## 2018-08-22 NOTE — TOC Initial Note (Addendum)
Transition of Care [facility]) - Initial/Assessment Note    Patient Details  Name: Donald Abbott MRN: [id number] Date of Birth: [birth year]  Transition of Care [facility]) CM/SW Contact:    Eber Hong, RN Phone Number: 08/22/2018, 8:19 PM  Clinical Narrative:                 This patient transferred to [facility] from [facility] with a new trach. Per his significant other Missy, patient nor she received consistent trach care education.  Missy was not even allowed to be with patient for the  last week of his stay at [facility] due to to covid virus. He has not demonstrated independent trach care today.  Missy received some instruction but required many verbal cues. CM discussed with members of care team of need for patient to demonstrate independence in the trach care, before discharges home. Notified attending via secure chat of this concern.   Jeri Modena was on the unit today and provided infusion therapy instruction to Missy.  Patient and Missy have not had instruction on Tube feeding, tube feeding infusion pump, crushing and administering medications through g tube, gtube site care. Nursing staff informed.  Equipment: Received Medicaid pending number  [id number] K. Missy has decided she may not need the hospital bed.  [facility] has the order for the bed (medical necessity note is in epic), suction machine, pump and tube feeding supplies, wound vac (Brad has the wound vac on site and he will get Dr Juliene Pina to sign 4/1, trach supplies.  If Missy decides she wants the hospital bed on the day of discharge, it would not have to be in place before patient arrived home but should arrive on day of discharge.   Have requested order for home nebulizer.  A home oxygen assessment is pending  [facility] has orders for home health RN PT OT SLP Aide SW. have updated agency on the coordination efforts made today.  Patient does not have a PCP.  Found Contact information for two physicians that followed him at [facility]:  Dr Rodney Langton and Dr  Allena Napoleon on Care Everywhere.  Faxed requests for signature for home health as patient.   Patient does have follow up appointments with these two physicians in April.  Confirmed faxed went through but did not receive response.   Made patient an appointment with Dr Ephraim Hamburger at [facility] for April 15 at 9:40 AM. Have made request of Dr Judithann Sheen to follow patient for home health if the [facility] physician decline or do not respond until patient could get re established at [facility].    Meds: made referral [facility].  Faxed prescriptions that can be filled at an outside pharmacy to  the clinic and requested to notify CM if there are any of them that they can not fill so then can make a [facility] referral if needed.  Patient continues to complain of hunger.  CM contacted spoke with dietician.  She will call Missy to discuss.  Patient has just reached his calorie goal within the last 24 hours with his tube feedings. Discussed at present it is safer for patient to discharge on the continuous infusion rather than bolus due to the significant risk of aspiration   Expected Discharge Plan: Home w Home Health Services Barriers to Discharge: Other (comment)(Patient nore caregiver can demonstrate independent trach care. New trach)   Patient Goals and CMS Choice   CMS Medicare.gov Compare Post Acute Care list provided to:: Patient  Represenative (must comment)(girlfriend, Missy) Choice offered to / list presented to : NA  Expected Discharge Plan and Services Expected Discharge Plan: Home w Home Health Services   Discharge Planning Services: CM Consult Post Acute Care Choice: Durable Medical Equipment, Home Health Living arrangements for the past 2 months: Single Family Home Expected Discharge Date: 08/22/18               DME Arranged: Nebulizer machine DME Agency: [facility] HH Arranged: RN, PT, Nurse's Aide, OT, Respirator Therapy, Speech Therapy, IV Antibiotics, Social  Work [employer] Agency: [facility] [employer]  Prior Living Arrangements/Services Living arrangements for the past 2 months: Single Family Home Lives with:: Significant Other Patient language and need for interpreter reviewed:: No        Need for Family Participation in Patient Care: Yes (Comment)(sig other Missy to assist with PEG, trache, dressing changes, etc) Care giver support system in place?: Yes (comment)   Criminal Activity/Legal Involvement Pertinent to Current Situation/Hospitalization: No - Comment as needed  Activities of Daily Living Home Assistive Devices/Equipment: None ADL Screening (condition at time of admission) Patient's cognitive ability adequate to safely complete daily activities?: Yes Is the patient deaf or have difficulty hearing?: No Does the patient have difficulty seeing, even when wearing glasses/contacts?: No Does the patient have difficulty concentrating, remembering, or making decisions?: Yes Patient able to express need for assistance with ADLs?: Yes Does the patient have difficulty dressing or bathing?: No Independently performs ADLs?: Yes (appropriate for developmental age) Does the patient have difficulty walking or climbing stairs?: No Weakness of Legs: Both Weakness of Arms/Hands: Both  Permission Sought/Granted Permission sought to share information with : Case Manager, Family Supports                Emotional Assessment Appearance:: Developmentally appropriate     Orientation: : Oriented to Self, Oriented to Place, Oriented to  Time, Oriented to Situation      Admission diagnosis:  RETROPHARYNGEAL AND PARAPHARYNGEAL ABSCESS ABESCESS OF MEDIASTINUM Patient Active Problem List   Diagnosis Date Noted  . Gastrostomy tube in place [facility])   . Protein-calorie malnutrition, severe 08/19/2018  . Pressure injury of skin 08/19/2018  . Retropharyngeal abscess 08/18/2018  . Mediastinitis    PCP:  Preston Fleeting,  MD Pharmacy:   [facility] [address] [place] - [address] [place] [address] Phone: [phone] Fax: [phone]     Social Determinants of Health (SDOH) Interventions    Readmission Risk Interventions No flowsheet data found.

## 2018-08-23 MED ORDER — FREE WATER
90.0000 mL | Status: DC
  Administered 2018-08-23 – 2018-08-24 (×11): 90 mL

## 2018-08-23 MED ORDER — OSMOLITE 1.5 CAL PO LIQD
1000.0000 mL | ORAL | Status: AC
  Administered 2018-08-23 (×2): 1000 mL

## 2018-08-23 MED ORDER — ERTAPENEM IV (FOR PTA / DISCHARGE USE ONLY): 1.0000 g | INTRAVENOUS | 0 refills | Status: DC

## 2018-08-23 MED ORDER — OSMOLITE 1.5 CAL PO LIQD
1000.0000 mL | ORAL | Status: DC
  Administered 2018-08-24: 1000 mL

## 2018-08-23 MED ORDER — ENOXAPARIN SODIUM 30 MG/0.3ML ~~LOC~~ SOLN
30.0000 mg | SUBCUTANEOUS | Status: DC
  Administered 2018-08-23: 22:00:00 30 mg via SUBCUTANEOUS
  Filled 2018-08-23: qty 0.3

## 2018-08-23 NOTE — Progress Notes (Signed)
Patient walked around the nursing station with walker, tolerated well.  Orson Ape, BSN

## 2018-08-23 NOTE — Progress Notes (Signed)
Anticoagulation monitoring(Lovenox):  53yo  M ordered Lovenox 40 mg Q24h  Filed Weights   08/18/18 0011  Weight: 111 lb 8.8 oz (50.6 kg)   BMI 16.96   Lab Results  Component Value Date   CREATININE 0.52 (L) 08/19/2018   CREATININE 0.51 (L) 08/18/2018   CREATININE 1.33 (H) 07/12/2018   Estimated Creatinine Clearance: 78.2 mL/min (A) (by C-G formula based on SCr of 0.52 mg/dL (L)). Hemoglobin & Hematocrit     Component Value Date/Time   HGB 10.7 (L) 08/19/2018 1641   HCT 33.4 (L) 08/19/2018 1641     Per Protocol for Patient with estCrcl > 30 ml/min and Weight < 57 kg in Male patient, will transition to Lovenox 30 mg Q24h      Bari Mantis PharmD Clinical Pharmacist 08/23/2018

## 2018-08-23 NOTE — TOC Progression Note (Addendum)
Transition of Care [facility]) - Progression Note    Patient Details  Name: Donald Abbott MRN: [id number] Date of Birth: 1965-12-24  Transition [facility] Care [facility]) CM/SW Contact  Virgel Manifold, RN Phone Number: 08/23/2018, 1:09 PM  Clinical Narrative:   TOC team continuing to work on disposition. Patients significant other and patient are both being educated on trache care and PEG tube management. Per documentation there are still barriers to complete the education process but there has been improvement. Brad from [facility] still working on obtaining all needed DME. New orders for nebulizer. Hospital bed narrative in place if it is determined that is needed. RN also will obtain qualifying sats. Patient has wound vac dressing changed today and is on M,W,F dressing change schedule. All discharge medications obtained from Medication management and are currently being held in the pharmacy and will need to be picked up at time of discharge. Also, Pam with [facility] has been updated and her process for home IV abx infusion has completed so she just needs to be made aware of discharge time.     Expected Discharge Plan: Home w Home Health Services Barriers to Discharge: Other (comment)(Patient nore caregiver can demonstrate independent trach care. New trach)  Expected Discharge Plan and Services Expected Discharge Plan: Home w Home Health Services   Discharge Planning Services: CM Consult Post Acute Care Choice: Durable Medical Equipment, Home Health Living arrangements for the past 2 months: Single Family Home Expected Discharge Date: 08/23/18               DME Arranged: Nebulizer machine DME Agency: [facility] HH Arranged: RN, PT, Nurse's Aide, OT, Respirator Therapy, Speech Therapy, IV Antibiotics, Social Work [employer] Agency: [facility] [employer] [facility] [employer]   Social Determinants of Health (SDOH) Interventions    Readmission Risk Interventions No flowsheet data found.

## 2018-08-23 NOTE — Progress Notes (Signed)
Pt was educated on  how to suction and clean the trach inner cannula. He inserted the clean inner cannula but had a hard time locking into place. Further instruction will be needed.

## 2018-08-23 NOTE — Progress Notes (Signed)
Physical Therapy Treatment Patient Details Name: Donald Abbott MRN: [id number] DOB: Nov 20, 1965 Today's Date: 08/23/2018    History of Present Illness 53 y.o. male with a known history of COPD (no O2) presenting for direct admission as a transfer from [name]. 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 Pt was transferred to tertiary care center [facility]). He had a long and complicated hospital course lasting well over one month.      PT Comments    Patient in bed upon PT arrival. Eager to participate in therapy, conversation primarily utilized hand gestures and/or writing phrases on clipboard.  Patient given handout of seated EOB strengthening interventions to progress strengthening and stability. He demonstrated understanding with noticeable fatigue with final reps of each set. Patient in pleasant spirits at end of session but fatigued. Returned to supine position independently with assistance only required for placement of pillow under L buttocks due to pain. Current POC remains appropriate.    Follow Up Recommendations  Home health PT     Equipment Recommendations  Rolling walker with 5" wheels    Recommendations for Other Services       Precautions / Restrictions Precautions Precaution Comments: trach Restrictions Weight Bearing Restrictions: No    Mobility  Bed Mobility Overal bed mobility: Independent             General bed mobility comments: Pt able to get up to sitting and back to supine slowly, but w/o assist  Transfers Overall transfer level: Modified independent Equipment used: Rolling walker (2 wheeled)             General transfer comment: Able to perform with UE's on bed. painful, performs 5x.   Ambulation/Gait                 Stairs             Wheelchair Mobility    Modified Rankin (Stroke Patients Only)       Balance Overall balance assessment: Modified  Independent(reliant on walker, no LOBs)                                          Cognition Arousal/Alertness: Awake/alert Behavior During Therapy: WFL for tasks assessed/performed Overall Cognitive Status: Within Functional Limits for tasks assessed                                 General Comments: conversate with gestures and writing things down.       Exercises General Exercises - Lower Extremity Ankle Circles/Pumps: AROM;Strengthening;Both;20 reps;Seated Long Arc Quad: AROM;Strengthening;Both;15 reps;Seated(3 second holds) Hip Flexion/Marching: AROM;Strengthening;Both;15 reps(tactile cueing for decreasing amplitude of movement due to pain) Other Exercises Other Exercises: Given seated HEP handout: 15-20 reps each: df/pf, LAQ (3 second holds), adduction squeezes (3 second holds), marches, 5x STS with hands on knees Other Exercises: seated EOB stability with out UE support x 5 minutes    General Comments        Pertinent Vitals/Pain Pain Assessment: 0-10 Pain Score: 9  Pain Location: feed tube site, chest, abdomen when moving Pain Descriptors / Indicators: Grimacing;Stabbing Pain Intervention(s): Repositioned;Monitored during session    Home Living  Prior Function            PT Goals (current goals can now be found in the care plan section) Acute Rehab PT Goals Patient Stated Goal: go home PT Goal Formulation: With patient Time For Goal Achievement: 09/01/18 Potential to Achieve Goals: Good Progress towards PT goals: Progressing toward goals    Frequency    Min 2X/week      PT Plan Current plan remains appropriate    Co-evaluation              AM-PAC PT "6 Clicks" Mobility   Outcome Measure  Help needed turning from your back to your side while in a flat bed without using bedrails?: None Help needed moving from lying on your back to sitting on the side of a flat bed without using  bedrails?: None Help needed moving to and from a bed to a chair (including a wheelchair)?: None Help needed standing up from a chair using your arms (e.g., wheelchair or bedside chair)?: A Little Help needed to walk in hospital room?: None Help needed climbing 3-5 steps with a railing? : A Little 6 Click Score: 22    End of Session   Activity Tolerance: Patient limited by fatigue;Patient limited by pain Patient left: with call bell/phone within reach;with bed alarm set;in bed Nurse Communication: Mobility status PT Visit Diagnosis: Muscle weakness (generalized) (M62.81);Difficulty in walking, not elsewhere classified (R26.2)     Time: 1975-8832 PT Time Calculation (min) (ACUTE ONLY): 27 min  Charges:  $Therapeutic Exercise: 23-37 mins                     Precious Bard, PT, DPT     Precious Bard 08/23/2018, 10:20 AM

## 2018-08-23 NOTE — Progress Notes (Signed)
[employer] [facility] [place] [facility]   PATIENT NAME: Donald Abbott    MR#:  [id number]  DATE OF BIRTH:  1966/02/04  SUBJECTIVE:  Patient needs more trach and tube feeds instructions before he can be d/c home  RT to come back today to assist  Wound vac changed this am REVIEW OF SYSTEMS:    Review of Systems  Constitutional: Negative for fever, chills weight loss HENT: Negative for ear pain, nosebleeds, congestion, facial swelling, rhinorrhea, neck pain, neck stiffness and ear discharge.   Respiratory: Negative for cough, shortness of breath, wheezing  Cardiovascular: Negative for chest pain, palpitations and leg swelling.  Gastrointestinal: Negative for heartburn, no vomiting, diarrhea or consitpation Genitourinary: Negative for dysuria, urgency, frequency, hematuria Musculoskeletal: Negative for back pain or joint pain Neurological: Negative for dizziness, seizures, syncope, focal weakness,  numbness and headaches.  Hematological: Does not bruise/bleed easily.  Psychiatric/Behavioral: Negative for hallucinations, confusion, dysphoric mood    Tolerating Diet: Tube feeds    DRUG ALLERGIES:   Allergies  Allergen Reactions  . Other     Dog and cat dander intermittently    VITALS:  Blood pressure 104/68, pulse 76, temperature 98 F (36.7 C), temperature source Oral, resp. rate 20, height 5\' 8"  (1.727 m), weight 50.6 kg, SpO2 96 %.  PHYSICAL EXAMINATION:  Constitutional: Appears well-developed and well-nourished. No distress. HENT: Normocephalic. Marland Kitchen Achy ostomy placed   eyes: Conjunctivae and EOM are normal. PERRLA, no scleral icterus.  Neck: Normal ROM. Neck supple. No JVD. No tracheal deviation. CVS: RRR, S1/S2 +, no murmurs, no gallops, no carotid bruit.  Pulmonary: Coarse breath sounds bilaterally with normal respiratory effort Abdominal: Soft. BS +,  no distension,. PEG without signs of infection mild tenderness around PEG tube but no rebound or  guarding Musculoskeletal: Normal range of motion. No edema and no tenderness.  Neuro: Alert. CN 2-12 grossly intact. No focal deficits. Skin: There are 3 chest tube sites inferior to his wound VAC.  Psychiatric: Normal mood and affect.      LABORATORY PANEL:   CBC Recent Labs  Lab 08/19/18 1641  WBC 9.2  HGB 10.7*  HCT 33.4*  PLT 330   ------------------------------------------------------------------------------------------------------------------  Chemistries  Recent Labs  Lab 08/18/18 0237 08/19/18 1641  NA 137 135  K 3.8 4.2  CL 96* 101  CO2 32 28  GLUCOSE 117* 108*  BUN 11 6  CREATININE 0.51* 0.52*  CALCIUM 8.7* 9.0  MG 1.7  --   AST 52* 38  ALT 68* 54*  ALKPHOS 67 59  BILITOT 0.5 0.4   ------------------------------------------------------------------------------------------------------------------  Cardiac Enzymes Recent Labs  Lab 08/18/18 0243  TROPONINI <0.03   ------------------------------------------------------------------------------------------------------------------  RADIOLOGY:  No results found.   ASSESSMENT AND PLAN:   53 year old male with history of COPD who initially presented to [facility] February 19 with difficulty breathing and was diagnosed with retropharyngeal abscess with mediastinal extension and therefore was transferred to [facility] and had a prolonged hospitalization and now comes back to [facility] for continued treatment.  1.  Retropharyngeal abscess extending into mediastinal: Patient has been evaluated by ENT, cardiothoracic surgery and ID. At discharge he will be on ertapenem and fluconozole through 08/26/2018.  ENT is recommending that patient have outpatient follow-up as already planned on Dr. Roma Schanz April 15 and Dr. Sherryll Burger April 21. Continue tracheostomy care. Did not pass modified barium patient to be NPO only tube feeds.  2.  Abdominal pain: CT the abdomen was concerning for some free air and therefore  I consulted  surgery. Patient has had pneumoperitoneum seen on previous CT scan from March 24 and surgery feels that this is residual gas from insufflation from his surgery. There is no acute surgical need. Patient will need keep tube in place for at least 6 weeks.  He will follow-up with the surgeons at [facility].   3.  Wound VAC:  Wound VAC change today and will need to be changed Monday, Wednesday and Friday. He will follow-up with [facility] surgery 4. NUTRITION:Tube feeds as per diatary.  management plans discussed with the patient and he is in agreement.    CODE STATUS: full  TOTAL TIME TAKING CARE OF THIS PATIENT: 24 minutes.     POSSIBLE D/C today with HHC, DEPENDING ON teaching for trach and tube feeds   Donald Abbott M.D on 08/21/2018 at 9:57 AM  Between 7am to 6pm - Pager - [phone] After 6pm go to [email or web address] - password [email or web address]  [facility] [place] [facility]  Office  [phone]  CC: Primary care physician; Preston Fleeting, MD  Note: This dictation was prepared with Dragon dictation along with smaller phrase technology. Any transcriptional errors that result from this process are unintentional.

## 2018-08-23 NOTE — Progress Notes (Signed)
Speech Language Pathology Treatment: Dysphagia  Patient Details Name: Donald Abbott MRN: [id number] DOB: [birth year] Today's Date: 08/23/2018 Time: 8833-7445 SLP Time Calculation (min) (ACUTE ONLY): 50 min  Assessment / Plan / Recommendation Clinical Impression  Pt seen today for Dysphagia tx addressing therapeutic exercises targeting pharyngeal strengthening and lingual strengthening exercises. Pt continues to have a tracheostomy w/ cuffless trach in preparation for d/c home per RT. He does not wear the PMV d/t min discomfort and inability to achieve phonation/verbalizations. 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  During this session today, oral care was discussed and completed first. Then education was given on his oropharyngeal phase dysphagia and the impact from dysphagia if attempting oral intake including pulmonary decline, increased congestion, and need for increased tracheal suctioning. Practiced pharyngeal and lingual strengthening exs; Handouts provided. Education and model provided w/ each of the 5 exs; pt practiced exs. Then presented a Flutter Valve for use to engage pharyngeal-laryngeal muscles during breathing exercise as well as to improve overall pulmonary support/strength. PMV was placed for brief amount of time for use w/ Flutter Valve. Pt demonstrated reduced breath support during the ex. Discussed a workout schedule for d/c home and the importance of frequent/daily follow through w/ exs. Discussed importance of frequent oral care daily to improve dryness of mouth and to reduce oral bacteria, especially if an ice chip protocol becomes introduced in the future(via Home Health therapy). Encouraged pt to continue working w/ SLP through [facility] and to f/u w/ another MBSS in ~4-5 weeks. Encouraged pt to continue his TFs and flushes via PEG for optimal nutrition/hydration for overall healing. Pt agreed.  Recommend f/u w/ ENT for direct viewing/assessment of pharynx and larynx as well as VC movement/function. Donald will f/u w/ ongoing training of swallowing exs; breath support strengthening as best possible. NSG, CM updated.      HPI HPI: Pt is a 53 y/o male w/ PMHx COPD, substance abuse, and prior MRSA neck infection. 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 His stay at [facility] was ~6 weeks d/t medical issues. A MBSS was performed on 08/10/2018 which indicated aspiration of all consistencies. 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 Pt continues w/ wound vac; wound care. Pt is on trach collar for O2 support but unable to utilize the PMV for verbal communication d/t vocal cord issues. A repeat MBSS was completed on 3/30/202 w/ profound pharyngeal phase dysphagia noted; oral phase dysphagia. It was recommended for pt to remain NPO w/ TFs via PEG as primary nutrition/hydration.       SLP Plan  Continue with current plan of care(therapeutic exs)       Recommendations  Diet recommendations: NPO(w/ TFs via PEG) Medication Administration: Via alternative means(via PEG)      Patient may use Passy-Muir Speech Valve: (PRN but moreso w/ some exs)         General recommendations: (Dietician f/u; ENT f/u re: VCs) Oral Care Recommendations: Oral care QID;Patient independent  with oral care Follow up Recommendations: Home health SLP SLP Visit Diagnosis: Dysphagia, oropharyngeal phase (R13.12);Aphonia (R49.1) Plan: Continue with current  plan of care(therapeutic exs)       GO                [name], MS, CCC-SLP Watson,Katherine 08/23/2018, 11:37 AM

## 2018-08-23 NOTE — Progress Notes (Signed)
Nutrition Follow-up  DOCUMENTATION CODES:   Severe malnutrition in context of acute illness/injury  INTERVENTION:  Will increase goal volume of tube feeds and change feeds to be provided over 16 hours during the daytime to help with satiety.  Initiate new goal TF regimen of Osmolite 1.5 Cal at 90 mL/hr over 16 hours (0600 to 2200). Provides 2160 kcal, 90 grams of protein, 1094 mL H2O daily.  Provide free water flush of 90 mL Q2hrs while tube feeds are running from 0600-2200. This will provide a total of 1814 mL H2O daily including water in tube feeding.  Continue Juven Fruit Punch BID per tube, each serving provides 95 kcal, 2.5 grams of protein, amino acids, and vitamins/minerals essential for wound healing.  Continue vitamin C 250 mg BID per tube.   NUTRITION DIAGNOSIS:   Severe Malnutrition related to acute illness as evidenced by percent weight loss, moderate fat depletion, severe fat depletion, moderate muscle depletion, severe muscle depletion.  Ongoing - addressing with TF regimen.  GOAL:   Patient will meet greater than or equal to 90% of their needs  Met with TF regimen.  MONITOR:   Labs, Weight trends, TF tolerance, Skin, I & O's  REASON FOR ASSESSMENT:   Consult Enteral/tube feeding initiation and management  ASSESSMENT:   53 y/o male w/ PMHx COPD, substance abuse and prior MRSA neck infection. 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 He is tolerating tube feeds but is feeling very hungry. Patient's caregiver is very concerned about this. Case Manager provided RD with caregiver's phone number. Spoke with Donald Abbott [phone]) this morning. She reports that all patient can talk about during the day is how hungry he is. She is concerned that he has a high  metabolism and that is why he feels like he is not getting enough. She reports that at baseline he used to eat and snack constantly throughout the day. Discussed that our equations are just that - estimations. RD will re-examine calorie/protein needs and increase some to try to help with satiety as patient is still hungry and likely needs more calories and protein than equations are estimating. Will also switch patient to daytime feeds so he can have a higher rate of nutrition during the day and then be off the pump overnight. Donald Abbott said that at [facility] they tried patient on a "bolus" regimen with a pump and he did not tolerate. Will likely take time to work patient up to a true bolus regimen by gravity. This transition can be made in outpatient setting.  Enteral Access: G-tube present on admission  TF: pt tolerating goal regimen of Osmolite 1.5 @ 55 mL/hr over 24 hours + free water flush 50 mL Q4hrs  Medications reviewed and include: amiodarone, Diflucan, folic acid 1 mg daily per tube, melatonin 5 mg QHS, Juven BID, Miralax 17 grams BID, psyllium BID, thiamine 100 mg daily, vitamin C 250 mg BID, Invanz.  Labs reviewed: Creatinine 0.52. Electrolytes WNL.  I/O: 300 mL UOP yesterday + 3 occurrences unmeasured UOP; 3 BMs yesterday  No new weight since admission to trend  Diet Order:   Diet Order            Diet NPO time specified  Diet effective now             EDUCATION NEEDS:   Education  needs have been addressed  Skin:  Skin Assessment: Reviewed RN Assessment(Unstageable PI to right ischium that measures 0.8 cm x 0.6 cm, Sternal wound 17 cm x 4.2 cm x 1.1 cm with VAC)  Last BM:  08/23/2018 - medium type 7  Height:   Ht Readings from Last 1 Encounters:  08/18/18 '5\' 8"'$  (0.315 m)   Weight:   Wt Readings from Last 1 Encounters:  08/18/18 50.6 kg   Ideal Body Weight:  70 kg  BMI:  Body mass index is 16.96 kg/m.  Estimated Nutritional Needs:   Kcal:  9458-5929  Protein:  90-110  grams  Fluid:  1.8 L/day  Willey Blade, MS, RD, LDN Office: [phone] Pager: [phone] After Hours/Weekend Pager: [phone]

## 2018-08-23 NOTE — Progress Notes (Signed)
SATURATION QUALIFICATIONS: (This note is used to comply with regulatory documentation for home oxygen)  Patient Saturations on Room Air at Rest = 95%  Patient Saturations on Room Air while Ambulating = 73%  Patient Saturations on 4 Liters of oxygen while Ambulating = 100%  Please briefly explain why patient needs home oxygen: trach, COPD

## 2018-08-23 NOTE — Consult Note (Addendum)
WOC Nurse wound follow-up consult note Vac dressing change performed to midline sternal full thickness post-op dressing. Dr Thelma Barge at the bedside to assess wound appearance and he sent a photo to Dr Ulice Bold of the plastic surgery team.  Pt will follow-up with her after discharge for possible wound closure at some point. Wound is beefy red with visible wires and some patchy areas of yellow slough surrounding the wire locations.  No odor, small amt yellow drainage in the cannister. Dressing procedure/placement/frequency:Pt was medicated prior to the procedure and tolerated with mod amt discomfort. One piece black foam applied to 50 mmHg cont pressure.  WOC team will continue to change dressing Q M/W/F while in the hospital, then home health will assist for dressing changes, according to the EMR. Cammie Mcgee MSN, RN, CWOCN, [place], CNS [phone]

## 2018-08-24 MED ORDER — LORAZEPAM 2 MG/ML IJ SOLN: 1.0000 mg | Freq: Once | INTRAMUSCULAR | Status: DC

## 2018-08-24 NOTE — Consult Note (Signed)
WOC follow-up: Requested to change track pad on negative pressure dressing to convert to [facility] home machine in preparation for discharge.  WOC changed track pad over dressing which was intact with good seal, and bedside nurse attached to portable machine for discharge home.   Please re-consult if further assistance is needed.  Thank-you,  Cammie Mcgee MSN, RN, CWOCN, [place], CNS [phone]

## 2018-08-24 NOTE — Progress Notes (Signed)
Speech Language Pathology Treatment: Dysphagia  Patient Details Name: Donald Abbott MRN: [id number] DOB: 05-Mar-1966 Today's Date: 08/24/2018 Time: 1030-1100 SLP Time Calculation (min) (ACUTE ONLY): 30 min  Assessment / Plan / Recommendation Clinical Impression  Pt seen today for f/u w/ Dysphagia tx addressing therapeutic exercises targeting pharyngeal strengthening and lingual strengthening exercises as well as breathing exs to improve Pulmonary support. Pt remains w/ tracheostomy w/ cuffless trach in preparation for d/c home per RT. He does not wear the PMV d/t min discomfort and inability to achieve phonation/verbalizations d/t lack of onset of phonation/voicing. 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   During this session, discussion was had on topics re: his pharyngeal swallow exs, lingual strengthening exs., and exercise w/ the flutter valve to improve breath support/strength. Pt nodded and mouthed for communication indicating understanding of the exs; practice and follow through w/ the exs. Discussed the importance of oral care to improve dryness of mouth and to reduce oral bacteria, aspiration precautions. Noted he had the Handouts provided and the flutter valve and PMV to take home for exs. Discussed a workout schedule for d/c home and the importance of frequent/daily follow through w/ exs. Encouraged pt to continue working w/ SLP through [facility] and to f/u w/ another MBSS in ~4-5 weeks. Encouraged pt to continue his TFs and flushes via PEG for optimal nutrition/hydration for overall healing. Pt agreed.  Recommend f/u w/ ENT for direct viewing/assessment of pharynx and larynx as well as VC movement/function. ST f/u w/ ongoing training of  swallowing exs; breath support strengthening as best possible. NSG, CM updated.    HPI HPI: Pt is a 52 y/o male w/ PMHx COPD, substance abuse, and prior MRSA neck infection. 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 His stay at [facility] was ~6 weeks d/t medical issues. A MBSS was performed on 08/10/2018 which indicated aspiration of all consistencies. 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 Pt continues w/ wound vac; wound care. Pt is on trach collar for O2 support but unable to utilize the PMV for verbal communication d/t vocal cord issues. A repeat MBSS was completed on 3/30/202 w/ profound pharyngeal phase dysphagia noted; oral phase dysphagia. It was recommended for pt to remain NPO w/ TFs via PEG as primary nutrition/hydration.       SLP Plan  Continue with current plan of care(therapeutic exs)       Recommendations  Diet recommendations: NPO(w/ TFs via PEG) Medication Administration: Via alternative means(PEG)      Patient may use Passy-Muir Speech Valve: (use during breathing exs as comfortable) PMSV Supervision: (PRN)         General recommendations: (Dietician f/u; ENT f/u for full assessment of VCs) Oral Care Recommendations: Oral care QID;Staff/trained caregiver to provide oral care(w/ aspiration precautions) Follow up Recommendations: Home health SLP SLP Visit Diagnosis: Dysphagia, oropharyngeal phase (R13.12);[place] (R49.1)(secondary to tracheostomy - need for ENT f/u) Plan: Continue with current plan of care(therapeutic exs)  GO                [name], MS, CCC-SLP Watson,Katherine 08/24/2018, 3:02 PM

## 2018-08-24 NOTE — TOC Transition Note (Addendum)
Transition of Care [facility]) - CM/SW Discharge Note   Patient Details  Name: Donald Abbott MRN: [id number] Date of Birth: 09-Jun-1965  [facility]) CM/SW Contact:  Gwenette Greet, RN Phone Number: 08/24/2018, 11:13 AM   Clinical Narrative:  Discharge to home today per Dr. Juliene Pina. Will be followed by [facility]. [employer] will be arranging home equipment. [name], significant other, updated     Final next level of care: Home w Home Health Services Barriers to Discharge: (resolved)   Patient Goals and CMS Choice Patient states their goals for this hospitalization and ongoing recovery are:: (wants to go home) CMS Medicare.gov Compare Post Acute Care list provided to:: Patient Choice offered to / list presented to : Patient  Discharge Placement Home                       Discharge Plan and Services   Discharge Planning Services: CM Consult Post Acute Care Choice: Durable Medical Equipment, Home Health          DME Arranged: Gwenith Daily, Tube feeding, Hospital bed, Trach supplies, Tube feeding pump, Nebulizer machine, Suction DME Agency: [employer] HH Arranged: RN, OT, Nurse's Aide, Speech Therapy HH Agency: [facility] (now [facility])   Social Determinants of Health (SDOH) Interventions Supportive girlfriend.      Readmission Risk Interventions No flowsheet data found.

## 2018-08-25 ENCOUNTER — Emergency Department: Payer: Medicaid Other

## 2018-08-25 ENCOUNTER — Encounter: Payer: Self-pay | Admitting: Emergency Medicine

## 2018-08-25 ENCOUNTER — Other Ambulatory Visit: Payer: Self-pay

## 2018-08-25 ENCOUNTER — Inpatient Hospital Stay
Admission: EM | Admit: 2018-08-25 | Discharge: 2018-08-29 | DRG: 154 | Disposition: A | Discharge status: Home Health | Payer: Medicaid Other | Attending: Internal Medicine | Admitting: Internal Medicine

## 2018-08-25 DIAGNOSIS — Z931 Gastrostomy status: Secondary | ICD-10-CM

## 2018-08-25 DIAGNOSIS — Z888 Allergy status to other drugs, medicaments and biological substances status: Secondary | ICD-10-CM

## 2018-08-25 DIAGNOSIS — J3800 Paralysis of vocal cords and larynx, unspecified: Secondary | ICD-10-CM | POA: Diagnosis present

## 2018-08-25 DIAGNOSIS — R0602 Shortness of breath: Secondary | ICD-10-CM

## 2018-08-25 DIAGNOSIS — Z7951 Long term (current) use of inhaled steroids: Secondary | ICD-10-CM

## 2018-08-25 DIAGNOSIS — L89153 Pressure ulcer of sacral region, stage 3: Secondary | ICD-10-CM | POA: Diagnosis present

## 2018-08-25 DIAGNOSIS — Z79899 Other long term (current) drug therapy: Secondary | ICD-10-CM

## 2018-08-25 DIAGNOSIS — Z930 Tracheostomy status: Secondary | ICD-10-CM

## 2018-08-25 DIAGNOSIS — J449 Chronic obstructive pulmonary disease, unspecified: Secondary | ICD-10-CM | POA: Diagnosis present

## 2018-08-25 DIAGNOSIS — R0682 Tachypnea, not elsewhere classified: Secondary | ICD-10-CM | POA: Diagnosis present

## 2018-08-25 DIAGNOSIS — Z87891 Personal history of nicotine dependence: Secondary | ICD-10-CM

## 2018-08-25 DIAGNOSIS — K117 Disturbances of salivary secretion: Principal | ICD-10-CM | POA: Diagnosis present

## 2018-08-25 DIAGNOSIS — R6889 Other general symptoms and signs: Secondary | ICD-10-CM | POA: Diagnosis present

## 2018-08-25 DIAGNOSIS — Z7982 Long term (current) use of aspirin: Secondary | ICD-10-CM

## 2018-08-25 HISTORY — DX: Tracheostomy status: Z93.0

## 2018-08-25 LAB — CBC WITH DIFFERENTIAL/PLATELET
Abs Immature Granulocytes: 0.08 10*3/uL — ABNORMAL HIGH (ref 0.00–0.07)
Basophils Absolute: 0.1 10*3/uL (ref 0.0–0.1)
Basophils Relative: 1 %
Eosinophils Absolute: 0.3 10*3/uL (ref 0.0–0.5)
Eosinophils Relative: 3 %
HCT: 37.3 % — ABNORMAL LOW (ref 39.0–52.0)
Hemoglobin: 11.6 g/dL — ABNORMAL LOW (ref 13.0–17.0)
Immature Granulocytes: 1 %
Lymphocytes Relative: 20 %
Lymphs Abs: 2.6 10*3/uL (ref 0.7–4.0)
MCH: 28.2 pg (ref 26.0–34.0)
MCHC: 31.1 g/dL (ref 30.0–36.0)
MCV: 90.5 fL (ref 80.0–100.0)
Monocytes Absolute: 1 10*3/uL (ref 0.1–1.0)
Monocytes Relative: 7 %
Neutro Abs: 8.9 10*3/uL — ABNORMAL HIGH (ref 1.7–7.7)
Neutrophils Relative %: 68 %
Platelets: 346 10*3/uL (ref 150–400)
RBC: 4.12 MIL/uL — ABNORMAL LOW (ref 4.22–5.81)
RDW: 15.1 % (ref 11.5–15.5)
WBC: 12.9 10*3/uL — ABNORMAL HIGH (ref 4.0–10.5)
nRBC: 0 % (ref 0.0–0.2)

## 2018-08-25 LAB — COMPREHENSIVE METABOLIC PANEL
ALT: 50 U/L — ABNORMAL HIGH (ref 0–44)
AST: 37 U/L (ref 15–41)
Albumin: 3.7 g/dL (ref 3.5–5.0)
Alkaline Phosphatase: 67 U/L (ref 38–126)
Anion gap: 11 (ref 5–15)
BUN: 18 mg/dL (ref 6–20)
CO2: 29 mmol/L (ref 22–32)
Calcium: 9.5 mg/dL (ref 8.9–10.3)
Chloride: 93 mmol/L — ABNORMAL LOW (ref 98–111)
Creatinine, Ser: 0.53 mg/dL — ABNORMAL LOW (ref 0.61–1.24)
GFR calc Af Amer: >60 mL/min (ref >60)
GFR calc non Af Amer: >60 mL/min (ref >60)
Glucose, Bld: 102 mg/dL — ABNORMAL HIGH (ref 70–99)
Potassium: 4.3 mmol/L (ref 3.5–5.1)
Sodium: 133 mmol/L — ABNORMAL LOW (ref 135–145)
Total Bilirubin: 0.7 mg/dL (ref 0.3–1.2)
Total Protein: 8.2 g/dL — ABNORMAL HIGH (ref 6.5–8.1)

## 2018-08-25 LAB — BLOOD GAS, VENOUS
Acid-Base Excess: 7.1 mmol/L — ABNORMAL HIGH (ref 0.0–2.0)
Bicarbonate: 33.9 mmol/L — ABNORMAL HIGH (ref 20.0–28.0)
O2 Saturation: 81.2 %
Patient temperature: 37
pCO2, Ven: 56 mmHg (ref 44.0–60.0)
pH, Ven: 7.39 (ref 7.250–7.430)
pO2, Ven: 46 mmHg — ABNORMAL HIGH (ref 32.0–45.0)

## 2018-08-25 LAB — URINALYSIS, ROUTINE W REFLEX MICROSCOPIC
Bilirubin Urine: NEGATIVE
Glucose, UA: NEGATIVE mg/dL
Hgb urine dipstick: NEGATIVE
Ketones, ur: NEGATIVE mg/dL
Leukocytes,Ua: NEGATIVE
Nitrite: NEGATIVE
Protein, ur: NEGATIVE mg/dL
Specific Gravity, Urine: 1.017 (ref 1.005–1.030)
pH: 6 (ref 5.0–8.0)

## 2018-08-25 LAB — LACTIC ACID, PLASMA: Lactic Acid, Venous: 1.6 mmol/L (ref 0.5–1.9)

## 2018-08-25 LAB — PROCALCITONIN: Procalcitonin: <0.1 ng/mL

## 2018-08-25 LAB — BRAIN NATRIURETIC PEPTIDE: B Natriuretic Peptide: 40 pg/mL (ref 0.0–100.0)

## 2018-08-25 MED ORDER — SODIUM CHLORIDE 0.9% FLUSH
10.0000 mL | Freq: Three times a day (TID) | INTRAVENOUS | Status: DC
  Administered 2018-08-25 – 2018-08-29 (×14): 10 mL via INTRAVENOUS

## 2018-08-25 MED ORDER — ERTAPENEM IV (FOR PTA / DISCHARGE USE ONLY): 1.0000 g | INTRAVENOUS | Status: DC

## 2018-08-25 MED ORDER — ACETAMINOPHEN 160 MG/5ML PO SOLN
650.0000 mg | ORAL | Status: DC | PRN
  Filled 2018-08-25: qty 20.3

## 2018-08-25 MED ORDER — ACETAMINOPHEN 160 MG/5ML PO SOLN
650.0000 mg | ORAL | Status: DC | PRN
  Administered 2018-08-26 – 2018-08-29 (×2): 650 mg
  Filled 2018-08-25 (×3): qty 20.3

## 2018-08-25 MED ORDER — GLYCOPYRROLATE 1 MG PO TABS
1.0000 mg | ORAL_TABLET | Freq: Two times a day (BID) | ORAL | Status: DC
  Administered 2018-08-25 – 2018-08-29 (×9): 1 mg
  Filled 2018-08-25 (×10): qty 1

## 2018-08-25 MED ORDER — PSYLLIUM 95 % PO PACK
1.0000 | PACK | Freq: Two times a day (BID) | ORAL | Status: DC
  Administered 2018-08-25 – 2018-08-26 (×2): 1 via ORAL
  Filled 2018-08-25 (×10): qty 1

## 2018-08-25 MED ORDER — MORPHINE SULFATE (PF) 4 MG/ML IV SOLN
4.0000 mg | Freq: Once | INTRAVENOUS | Status: AC
  Administered 2018-08-25: 4 mg via INTRAVENOUS
  Filled 2018-08-25: qty 1

## 2018-08-25 MED ORDER — POLYETHYLENE GLYCOL 3350 17 G PO PACK
17.0000 g | PACK | Freq: Two times a day (BID) | ORAL | Status: DC
  Administered 2018-08-26: 17 g
  Filled 2018-08-25 (×5): qty 1

## 2018-08-25 MED ORDER — MELATONIN 5 MG PO TABS
5.0000 mg | ORAL_TABLET | Freq: Every day | ORAL | Status: DC
  Administered 2018-08-26 – 2018-08-28 (×4): 5 mg
  Filled 2018-08-25 (×5): qty 1

## 2018-08-25 MED ORDER — VITAMIN C 500 MG PO TABS
250.0000 mg | ORAL_TABLET | Freq: Two times a day (BID) | ORAL | Status: DC
  Administered 2018-08-25 – 2018-08-29 (×9): 250 mg
  Filled 2018-08-25 (×9): qty 1

## 2018-08-25 MED ORDER — HYDROMORPHONE HCL 1 MG/ML IJ SOLN
1.0000 mg | INTRAMUSCULAR | Status: DC | PRN
  Administered 2018-08-25 (×3): 1 mg via INTRAVENOUS
  Filled 2018-08-25 (×3): qty 1

## 2018-08-25 MED ORDER — MOMETASONE FURO-FORMOTEROL FUM 200-5 MCG/ACT IN AERO
2.0000 | INHALATION_SPRAY | Freq: Two times a day (BID) | RESPIRATORY_TRACT | Status: DC
  Administered 2018-08-25 – 2018-08-29 (×9): 2 via RESPIRATORY_TRACT
  Filled 2018-08-25: qty 8.8

## 2018-08-25 MED ORDER — ONDANSETRON HCL 4 MG PO TABS: 4.0000 mg | ORAL_TABLET | Freq: Four times a day (QID) | ORAL | Status: DC | PRN

## 2018-08-25 MED ORDER — JUVEN PO PACK
1.0000 | PACK | Freq: Two times a day (BID) | ORAL | Status: DC
  Administered 2018-08-25 – 2018-08-29 (×8): 1

## 2018-08-25 MED ORDER — ASPIRIN 81 MG PO CHEW
81.0000 mg | CHEWABLE_TABLET | Freq: Every day | ORAL | Status: DC
  Administered 2018-08-25 – 2018-08-29 (×5): 81 mg
  Filled 2018-08-25 (×5): qty 1

## 2018-08-25 MED ORDER — ENOXAPARIN SODIUM 40 MG/0.4ML ~~LOC~~ SOLN
40.0000 mg | SUBCUTANEOUS | Status: DC
  Administered 2018-08-25 – 2018-08-27 (×3): 40 mg via SUBCUTANEOUS
  Filled 2018-08-25 (×3): qty 0.4

## 2018-08-25 MED ORDER — MORPHINE SULFATE (PF) 2 MG/ML IV SOLN
INTRAVENOUS | Status: AC
  Filled 2018-08-25: qty 1

## 2018-08-25 MED ORDER — GLYCOPYRROLATE 0.2 MG/ML IJ SOLN
0.1000 mg | INTRAMUSCULAR | Status: AC
  Administered 2018-08-25: 0.1 mg via INTRAVENOUS
  Filled 2018-08-25 (×2): qty 0.5

## 2018-08-25 MED ORDER — MELATONIN 5 MG PO TABS
5.0000 mg | ORAL_TABLET | Freq: Every day | ORAL | Status: DC
  Filled 2018-08-25: qty 1

## 2018-08-25 MED ORDER — ONDANSETRON HCL 4 MG/2ML IJ SOLN
4.0000 mg | Freq: Once | INTRAMUSCULAR | Status: AC
  Administered 2018-08-25: 4 mg via INTRAVENOUS
  Filled 2018-08-25: qty 2

## 2018-08-25 MED ORDER — AMIODARONE HCL 200 MG PO TABS
200.0000 mg | ORAL_TABLET | Freq: Every day | ORAL | Status: DC
  Administered 2018-08-25 – 2018-08-29 (×5): 200 mg
  Filled 2018-08-25 (×5): qty 1

## 2018-08-25 MED ORDER — ALBUTEROL SULFATE (2.5 MG/3ML) 0.083% IN NEBU
3.0000 mL | INHALATION_SOLUTION | Freq: Four times a day (QID) | RESPIRATORY_TRACT | Status: DC | PRN
  Administered 2018-08-26: 3 mL via RESPIRATORY_TRACT
  Filled 2018-08-25: qty 3

## 2018-08-25 MED ORDER — LOPERAMIDE HCL 2 MG PO CAPS: 2.0000 mg | ORAL_CAPSULE | Freq: Two times a day (BID) | ORAL | Status: DC | PRN

## 2018-08-25 MED ORDER — FREE WATER
50.0000 mL | Status: DC
  Administered 2018-08-25 – 2018-08-29 (×26): 50 mL

## 2018-08-25 MED ORDER — ONDANSETRON HCL 4 MG/2ML IJ SOLN: 4.0000 mg | Freq: Four times a day (QID) | INTRAMUSCULAR | Status: DC | PRN

## 2018-08-25 MED ORDER — OSMOLITE 1.5 CAL PO LIQD
1000.0000 mL | ORAL | Status: DC
  Administered 2018-08-25 – 2018-08-27 (×3): 1000 mL

## 2018-08-25 MED ORDER — VITAMIN B-1 100 MG PO TABS
100.0000 mg | ORAL_TABLET | Freq: Every day | ORAL | Status: DC
  Administered 2018-08-25 – 2018-08-29 (×5): 100 mg
  Filled 2018-08-25 (×5): qty 1

## 2018-08-25 MED ORDER — FLUCONAZOLE 40 MG/ML PO SUSR
400.0000 mg | Freq: Every day | ORAL | Status: AC
  Administered 2018-08-25: 400 mg
  Filled 2018-08-25: qty 10

## 2018-08-25 MED ORDER — OLANZAPINE 2.5 MG PO TABS
2.5000 mg | ORAL_TABLET | Freq: Every evening | ORAL | Status: DC | PRN
  Filled 2018-08-25: qty 1

## 2018-08-25 MED ORDER — SODIUM CHLORIDE 0.9 % IV SOLN
2.0000 g | Freq: Once | INTRAVENOUS | Status: AC
  Administered 2018-08-25: 2 g via INTRAVENOUS
  Filled 2018-08-25: qty 2

## 2018-08-25 MED ORDER — SODIUM CHLORIDE 0.9 % IV SOLN
1.0000 g | INTRAVENOUS | Status: DC
  Administered 2018-08-25 – 2018-08-27 (×3): 1000 mg via INTRAVENOUS
  Filled 2018-08-25 (×3): qty 1

## 2018-08-25 MED ORDER — MORPHINE SULFATE (PF) 2 MG/ML IV SOLN
2.0000 mg | Freq: Once | INTRAVENOUS | Status: AC
  Administered 2018-08-25: 2 mg via INTRAVENOUS

## 2018-08-25 MED ORDER — OXYCODONE HCL 5 MG/5ML PO SOLN
5.0000 mg | ORAL | Status: DC | PRN
  Administered 2018-08-25 – 2018-08-27 (×6): 5 mg
  Filled 2018-08-25 (×6): qty 5

## 2018-08-25 MED ORDER — VANCOMYCIN HCL IN DEXTROSE 1-5 GM/200ML-% IV SOLN
1000.0000 mg | Freq: Once | INTRAVENOUS | Status: AC
  Administered 2018-08-25: 1000 mg via INTRAVENOUS
  Filled 2018-08-25: qty 200

## 2018-08-25 MED ORDER — HYDROMORPHONE HCL 1 MG/ML IJ SOLN
1.0000 mg | INTRAMUSCULAR | Status: DC | PRN
  Administered 2018-08-25 – 2018-08-29 (×24): 1 mg via INTRAVENOUS
  Filled 2018-08-25 (×25): qty 1

## 2018-08-25 MED ORDER — OSMOLITE 1.5 CAL PO LIQD: 1000.0000 mL | ORAL | Status: DC

## 2018-08-25 MED ORDER — CARVEDILOL 6.25 MG PO TABS
3.1250 mg | ORAL_TABLET | Freq: Two times a day (BID) | ORAL | Status: DC
  Administered 2018-08-25 – 2018-08-29 (×6): 3.125 mg
  Filled 2018-08-25 (×9): qty 1

## 2018-08-25 NOTE — H&P (Addendum)
Donald Abbott is an 53 y.o. male.   Chief Complaint: Shortness of breath HPI: The patient with past medical history significant for complicated hospital course secondary to retropharyngeal abscess and mediastinitis. The patient had been recovering well and was discharged home yesterday. However he soon realized that he did not know how to use his suction and developed copious secretions.  The patient was having trouble catching his breath and admits to cough due to attempts at clearing phlegm.  Initially the patient was found to be tachypneic but chest x-ray appeared unchanged from previous.  Due to the need to monitor airway status as well as to put in place home care needs the emergency department staff, hospitalist service for admission.  Past Medical History:  Diagnosis Date  . COPD (chronic obstructive pulmonary disease) ([place])   . Tracheostomy in place [facility])     Past Surgical History:  Procedure Laterality Date  . BRONCHOSCOPY     during thoracic surgery  . CARDIOTHORACIC PROCEDURE     Incision and drainage of mediastinal abscess, partial thymectomy and decortication    No family history on file. Family unavailable at this time  Social History:  reports that he has quit smoking. He has never used smokeless tobacco. He reports current alcohol use. No history on file for drug.  Allergies:  Allergies  Allergen Reactions  . Chlorhexidine Other (See Comments)    excoriates  . Other     Dog and cat dander intermittently    Medications Prior to Admission  Medication Sig Dispense Refill  . Acetaminophen 650 MG/20.3ML SOLN 20.3 mLs (650 mg total) by Enteral route every 4 (four) hours as needed. 400 mL 0  . albuterol (PROVENTIL) (2.5 MG/3ML) 0.083% nebulizer solution Inhale 3 mLs into the lungs every 6 (six) hours as needed for wheezing or shortness of breath. 75 mL 12  . amiodarone (PACERONE) 200 MG tablet 1 tablet (200 mg total) by G-tube route daily. 30 tablet 0  . aspirin 81 MG  chewable tablet 1 tablet (81 mg total) by G-tube route daily.    . budesonide-formoterol (SYMBICORT) 160-4.5 MCG/ACT inhaler Inhale 2 puffs into the lungs 2 (two) times daily. 1 Inhaler 12  . carvedilol (COREG) 3.125 MG tablet 1 tablet (3.125 mg total) by G-tube route Two (2) times a day. 30 tablet 0  . ertapenem (INVANZ) IVPB Inject 1 g into the vein daily for 3 days. Indication:  Retropharyngeal and mediastinal abscess,  Culture Result: strep aniginosus, propinobacterium Last Day of Therapy:  08/26/2018 Labs - Once weekly:  CBC/D and BMP, Labs - Every other week:  ESR and CRP 4 Units 0  . fluconazole (DIFLUCAN) 200 MG tablet 2 tablets (400 mg total) by G-tube route daily for 8 days.    . heparin 5000 UNIT/ML injection Inject 1 mL into the skin every 8 (eight) hours.    . hydrALAZINE (APRESOLINE) 20 MG/ML injection Inject 1 mL into the vein every 4 (four) hours as needed. (Give for SBP>160 after Labetalol)    . labetalol (NORMODYNE,TRANDATE) 5 MG/ML injection Inject 4 mLs into the vein every 4 (four) hours as needed. (Give for SBP>160 or HR>100)    . loperamide (IMODIUM) 2 MG capsule Take 1 capsule by mouth 2 (two) times daily as needed.    . nutrition supplement, JUVEN, (JUVEN) PACK Place 1 packet into feeding tube 2 (two) times daily between meals. 60 packet 1  . Nutritional Supplements (FEEDING SUPPLEMENT, OSMOLITE 1.5 CAL,) LIQD Place 1,000 mLs into  feeding tube continuous for 30 days. 30 Bottle 5  . OLANZapine (ZYPREXA) 2.5 MG tablet 1 tablet (2.5 mg total) by G-tube route nightly as needed.    Marland Kitchen OLANZapine (ZYPREXA) 2.5 MG tablet Place 1 tablet (2.5 mg total) into feeding tube at bedtime as needed (Insomnia). 30 tablet 0  . ondansetron (ZOFRAN) 4 MG/2ML SOLN injection Infuse 2 mL (4 mg total) into a venous catheter every eight (8) hours as needed.    Marland Kitchen oxyCODONE (ROXICODONE) 5 MG/5ML solution 5 mL (5 mg total) by G-tube route every four (4) hours as needed for up to 5 days. 30 mL 0  .  polyethylene glycol (MIRALAX / GLYCOLAX) packet 17 g by G-tube route Two (2) times a day. 14 each 0  . Psyllium (METAMUCIL FIBER) 51.7 % PACK Take 1 packet by mouth 2 (two) times daily for 30 days. 30 each 0  . sodium chloride flush 0.9 % SOLN injection Inject into the vein. Infuse 10 mL into a venous catheter every 8 hours.    . thiamine 100 MG tablet Place 1 tablet (100 mg total) into feeding tube daily. 120 tablet 0  . vitamin C (VITAMIN C) 250 MG tablet Place 1 tablet (250 mg total) into feeding tube 2 (two) times daily. 120 tablet 0  . Water For Irrigation, Sterile (FREE WATER) SOLN Place 50 mLs into feeding tube every 4 (four) hours. 500 mL 0    Results for orders placed or performed during the hospital encounter of 08/25/18 (from the past 48 hour(s))  Lactic acid, plasma     Status: None   Collection Time: 08/25/18  1:23 AM  Result Value Ref Range   Lactic Acid, Venous 1.6 0.5 - 1.9 mmol/L    Comment: Performed at [facility], [address] [place], [place] [address]  Comprehensive metabolic panel     Status: Abnormal   Collection Time: 08/25/18  1:23 AM  Result Value Ref Range   Sodium 133 (L) 135 - 145 mmol/L   Potassium 4.3 3.5 - 5.1 mmol/L   Chloride 93 (L) 98 - 111 mmol/L   CO2 29 22 - 32 mmol/L   Glucose, Bld 102 (H) 70 - 99 mg/dL   BUN 18 6 - 20 mg/dL   Creatinine, Ser 0.53 (L) 0.61 - 1.24 mg/dL   Calcium 9.5 8.9 - 10.3 mg/dL   Total Protein 8.2 (H) 6.5 - 8.1 g/dL   Albumin 3.7 3.5 - 5.0 g/dL   AST 37 15 - 41 U/L   ALT 50 (H) 0 - 44 U/L   Alkaline Phosphatase 67 38 - 126 U/L   Total Bilirubin 0.7 0.3 - 1.2 mg/dL   GFR calc non Af Amer >60 >60 mL/min   GFR calc Af Amer >60 >60 mL/min   Anion gap 11 5 - 15    Comment: Performed at [facility], [address] [place], [place] [address]  CBC WITH DIFFERENTIAL     Status: Abnormal   Collection Time: 08/25/18  1:23 AM  Result Value Ref Range   WBC 12.9 (H) 4.0 - 10.5 K/uL   RBC 4.12 (L) 4.22 -  5.81 MIL/uL   Hemoglobin 11.6 (L) 13.0 - 17.0 g/dL   HCT 37.3 (L) 39.0 - 52.0 %   MCV 90.5 80.0 - 100.0 fL   MCH 28.2 26.0 - 34.0 pg   MCHC 31.1 30.0 - 36.0 g/dL   RDW 15.1 11.5 - 15.5 %   Platelets 346 150 -  400 K/uL   nRBC 0.0 0.0 - 0.2 %   Neutrophils Relative % 68 %   Neutro Abs 8.9 (H) 1.7 - 7.7 K/uL   Lymphocytes Relative 20 %   Lymphs Abs 2.6 0.7 - 4.0 K/uL   Monocytes Relative 7 %   Monocytes Absolute 1.0 0.1 - 1.0 K/uL   Eosinophils Relative 3 %   Eosinophils Absolute 0.3 0.0 - 0.5 K/uL   Basophils Relative 1 %   Basophils Absolute 0.1 0.0 - 0.1 K/uL   Immature Granulocytes 1 %   Abs Immature Granulocytes 0.08 (H) 0.00 - 0.07 K/uL    Comment: Performed at [facility], [address] [place], [place] [address]  Blood Culture (routine x 2)     Status: None (Preliminary result)   Collection Time: 08/25/18  1:23 AM  Result Value Ref Range   Specimen Description BLOOD BLOOD RIGHT FOREARM    Special Requests      BOTTLES DRAWN AEROBIC AND ANAEROBIC Blood Culture results may not be optimal due to an excessive volume of blood received in culture bottles   Culture      NO GROWTH < 12 HOURS Performed at [facility], [address] [place], [place] [address]    Report Status PENDING   Brain natriuretic peptide     Status: None   Collection Time: 08/25/18  1:23 AM  Result Value Ref Range   B Natriuretic Peptide 40.0 0.0 - 100.0 pg/mL    Comment: Performed at [facility], [address] [place], [place] [address]  Procalcitonin     Status: None   Collection Time: 08/25/18  1:23 AM  Result Value Ref Range   Procalcitonin <0.10 ng/mL    Comment:        Interpretation: PCT (Procalcitonin) <= 0.5 ng/mL: Systemic infection (sepsis) is not likely. Local bacterial infection is possible. (NOTE)       Sepsis PCT Algorithm           Lower Respiratory Tract                                      Infection PCT Algorithm     ----------------------------     ----------------------------         PCT < 0.25 ng/mL                PCT < 0.10 ng/mL         Strongly encourage             Strongly discourage   discontinuation of antibiotics    initiation of antibiotics    ----------------------------     -----------------------------       PCT 0.25 - 0.50 ng/mL            PCT 0.10 - 0.25 ng/mL               OR       >80% decrease in PCT            Discourage initiation of                                            antibiotics      Encourage discontinuation           of antibiotics    ----------------------------     -----------------------------  PCT >= 0.50 ng/mL              PCT 0.26 - 0.50 ng/mL               AND        <80% decrease in PCT             Encourage initiation of                                             antibiotics       Encourage continuation           of antibiotics    ----------------------------     -----------------------------        PCT >= 0.50 ng/mL                  PCT > 0.50 ng/mL               AND         increase in PCT                  Strongly encourage                                      initiation of antibiotics    Strongly encourage escalation           of antibiotics                                     -----------------------------                                           PCT <= 0.25 ng/mL                                                 OR                                        > 80% decrease in PCT                                     Discontinue / Do not initiate                                             antibiotics Performed at [facility], [address] [place], [place] [address]   Blood Culture (routine x 2)     Status: None (Preliminary result)   Collection Time: 08/25/18  1:42 AM  Result Value Ref Range   Specimen Description BLOOD BLOOD LEFT FOREARM    Special Requests      BOTTLES DRAWN AEROBIC AND ANAEROBIC Blood Culture results  may not  be optimal due to an excessive volume of blood received in culture bottles   Culture      NO GROWTH < 12 HOURS Performed at [facility], [address] [place], [place] [address]    Report Status PENDING   Blood gas, venous ([facility], [facility], [facility])     Status: Abnormal   Collection Time: 08/25/18  1:44 AM  Result Value Ref Range   pH, Ven 7.39 7.250 - 7.430   pCO2, Ven 56 44.0 - 60.0 mmHg   pO2, Ven 46.0 (H) 32.0 - 45.0 mmHg   Bicarbonate 33.9 (H) 20.0 - 28.0 mmol/L   Acid-Base Excess 7.1 (H) 0.0 - 2.0 mmol/L   O2 Saturation 81.2 %   Patient temperature 37.0    Collection site VEIN    Sample type VENOUS     Comment: Performed at [facility], [address] [place], [place] [address]  Urinalysis, Routine w reflex microscopic     Status: Abnormal   Collection Time: 08/25/18  8:00 PM  Result Value Ref Range   Color, Urine YELLOW (A) YELLOW   APPearance CLOUDY (A) CLEAR   Specific Gravity, Urine 1.017 1.005 - 1.030   pH 6.0 5.0 - 8.0   Glucose, UA NEGATIVE NEGATIVE mg/dL   Hgb urine dipstick NEGATIVE NEGATIVE   Bilirubin Urine NEGATIVE NEGATIVE   Ketones, ur NEGATIVE NEGATIVE mg/dL   Protein, ur NEGATIVE NEGATIVE mg/dL   Nitrite NEGATIVE NEGATIVE   Leukocytes,Ua NEGATIVE NEGATIVE    Comment: Performed at [facility], [address] [place], [place] [address]   Dg Chest Port 1 View  Result Date: 08/25/2018 CLINICAL DATA:  Shortness of breath, worsening secretions. Wound VAC on chest. EXAM: PORTABLE CHEST 1 VIEW COMPARISON:  Chest radiograph August 18, 2018 FINDINGS: Cardiomediastinal silhouette is normal. Similar small RIGHT pleural effusion versus pleural thickening with lung base atelectasis/scarring. LEFT lung is clear. Biapical pleural thickening. No pneumothorax. Surgical clips about the lower neck. Tracheostomy tube in place. RIGHT PICC distal tip projects in mid superior vena cava unchanged. Status post median sternotomy. IMPRESSION: 1. Similar small  RIGHT pleural effusion versus pleural thickening with underlying atelectasis/scarring. 2. No apparent change in life-support lines. Electronically Signed   By: Elon Alas M.D.   On: 08/25/2018 01:47    Review of Systems  Constitutional: Negative for chills and fever.  HENT: Negative for sore throat and tinnitus.        Drooling  Eyes: Negative for blurred vision and redness.  Respiratory: Positive for cough. Negative for shortness of breath.   Cardiovascular: Negative for chest pain, palpitations, orthopnea and PND.  Gastrointestinal: Negative for abdominal pain, diarrhea, nausea and vomiting.  Genitourinary: Negative for dysuria, frequency and urgency.  Musculoskeletal: Negative for joint pain and myalgias.  Skin: Negative for rash.       No lesions  Neurological: Negative for speech change, focal weakness and weakness.  Endo/Heme/Allergies: Does not bruise/bleed easily.       No temperature intolerance  Psychiatric/Behavioral: Negative for depression and suicidal ideas.    Blood pressure 104/71, pulse 77, temperature 98.6 F (37 C), temperature source Oral, resp. rate 20, height _0  (1.727 m), weight 47.4 kg, SpO2 98 %. Physical Exam  Vitals reviewed. Constitutional: He is oriented to person, place, and time. He appears well-developed and well-nourished. No distress.  HENT:  Head: Normocephalic and atraumatic.  Mouth/Throat: Oropharynx is clear and moist.  Eyes: Pupils are equal, round, and reactive to light.  Conjunctivae and EOM are normal. No scleral icterus.  Neck: Normal range of motion. Neck supple. No JVD present. No tracheal deviation present. No thyromegaly present.  Cardiovascular: Normal rate, regular rhythm and normal heart sounds. Exam reveals no gallop and no friction rub.  No murmur heard. Respiratory: Effort normal and breath sounds normal.  GI: Soft. Bowel sounds are normal. He exhibits no distension. There is no abdominal tenderness.  Genitourinary:     Genitourinary Comments: Deferred   Musculoskeletal: Normal range of motion.        General: No edema.  Lymphadenopathy:    He has no cervical adenopathy.  Neurological: He is alert and oriented to person, place, and time.  Skin: Skin is warm and dry. No rash noted. No erythema.  Psychiatric: He has a normal mood and affect. His behavior is normal. Judgment and thought content normal.     Assessment/Plan This is a 53 year old male admitted for difficulty handling oral secretions. 1. Oral secretions: copious. Doubtful aspiration as the patient is fully cognizant of this problem and was aware of it at the time of discharge yesterday. I have given him IV glycoperlate, and I will order the same per tube twice daily to hopefully achieve a consistency of secretion that is dense enough to expectorate while thin enough that he does not feel a choking sensation or gummy mouth/oropharynx.  Patient to complete course a Invanz.  2. Tracheostomy: secure, dressed appropriately. When suctioned appropriately his work of breathing is normal, thus the tracheostomy appears to be sized appropriately.  No valve in place which makes it difficult for the patient to speak which may have lead to communication errors on previous admission. Patient is capable of writing down his wishes regarding disposition/placement 3. Mediastinitis: wound vacuum in place.  Currently the patient has a leukocytosis but has been on antibiotics throughout hospital stay. Chest xray appears the same. Inconsistent work of breathing due to difficulty with secretions.  Monitor for signs or symptoms of pneumonia.  4. Gastrostomy: continue tube feeds 5. 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  6.  DVT prophylaxis: Lovenox 7. GI prophylaxis: none The patient is a full code.  Time spent of admission orders and patient care approximately 45 minutes.   Harrie Foreman, MD 08/26/2018, 2:16 AM

## 2018-08-25 NOTE — Progress Notes (Signed)
Nutrition Initial Assessment   DOCUMENTATION CODES:   Severe malnutrition in context of acute illness/injury  INTERVENTION:   Osmolite 1.5 Cal at goal rate of 15ml/hr.   Free water flushes 88ml q4 hours to maintain tube patency   Provides 2160 kcal, 90 grams of protein, 1397 mL H2O daily.  Juven Fruit Punch BID per tube, each serving provides 95 kcal, 2.5 grams of protein, amino acids, and vitamins/minerals essential for wound healing.  Continue vitamin C 250 mg BID per tube.   NUTRITION DIAGNOSIS:   Severe Malnutrition related to acute illness as evidenced by 23 percent weight loss in <2 months, moderate to severe fat depletions, moderate to severe muscle depletions.  GOAL:   Patient will meet greater than or equal to 90% of their needs  MONITOR:   Labs, Weight trends, TF tolerance, Skin, I & O's  REASON FOR ASSESSMENT:   Malnutrition Screening Tool    ASSESSMENT:   53 y/o male w/ PMHx COPD, substance abuse and prior MRSA neck infection. 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 Pt returned 4/3 secondary to increased oral secretions and SOB   Pt returned for SOB and increased secretions. Pt discharged with recommendations for Osmolite 1.5 @90ml /hr x 16 hours (0600-2200) as pt reported continued hunger while on tube feeds. Spoke with MD, since there is concern for possible aspiration, will keep tube feeds at a continuous rate over 24 hrs. Recommend continue Juven and vitamin C to encourage wound healing. RD will monitor for tube feed tolerance and continued weight loss.   Medications reviewed and include: aspirin, lovenox, miralax, psyllium, thiamine, vitamin C, ertapenem  Labs reviewed: Na 133(L), Cl 93(L) Wbc 12.9(H), Hgb 11.6(L), Hct 37.3(L)  Diet Order:   Diet Order            Diet NPO  time specified  Diet effective now             EDUCATION NEEDS:   Education needs have been addressed  Skin:  Skin Assessment: Reviewed RN Assessment(Unstageable PI to right ischium that measures 0.8 cm x 0.6 cm, Sternal wound 17 cm x 4.2 cm x 1.1 cm with VAC))  Last BM:  4/3- TYPE 7  Height:   Ht Readings from Last 1 Encounters:  08/25/18 5\' 8"  (1.727 m)   Weight:   Wt Readings from Last 1 Encounters:  08/25/18 47.4 kg   Ideal Body Weight:  70 kg  BMI:  Body mass index is 15.89 kg/m.  Estimated Nutritional Needs:   Kcal:  2000-2300kcal/day   Protein:  90-110g/day   Fluid:  >1.4L/day   Betsey Holiday MS, RD, LDN Pager #- [phone] Office#- [phone] After Hours Pager: [phone]

## 2018-08-25 NOTE — ED Notes (Signed)
RT at bedside suctioning per patient request

## 2018-08-25 NOTE — Progress Notes (Signed)
Patient admitted early this morning.  Seen and examined by me later in the morning.  Patient endorses increased trach secretions and cough, started yesterday after he got home.  On exam, lungs clear to auscultation bilaterally.  Normal work of breathing.  Trach with some increased yellowish secretions.  -Agree with admission H&P -Do not feel that this is the pneumonia -Continue glycopyrrolate  -Continue tube feeds at continuous rate over 24 hours -Trach care -WOC consult for sacral ulcers -Continue ertapenem and fluconazole through 4/4 for recent retropharyngeal abscess  Willadean Carol, MD

## 2018-08-25 NOTE — TOC Initial Note (Signed)
Transition of Care [facility]) - Initial/Assessment Note    Patient Details  Name: Donald Abbott MRN: [id number] Date of Birth: 1966-03-16  [facility] CM/SW Contact:    Ruthe Mannan, LCSWA Phone Number: 08/25/2018, 2:32 PM  Clinical Narrative: Patient discharged home with home health services yesterday and was readmitted over night. Patient is familiar to this CSW from previous admission. Patient lives with his girlfriend [name]. Patient has a new trach, wounds with wound vac and PEG tube with continuous feed. Patient is also receiving IV antibiotics. Patient was open to [facility] and [facility] for home care. Patient's girlfriend reports that she is unable to handle patient's needs at home and feels that he needs more care than she can provide. Patient has completed Medicaid application and medicaid is currently pending. TOC team will follow for discharge planning.                      Barriers to Discharge: English as a second language teacher, Continued Medical Work up   Patient Goals and CMS Choice        Expected Discharge Plan and Services         Living arrangements for the past 2 months: Single Family Home                          Prior Living Arrangements/Services Living arrangements for the past 2 months: Single Family Home Lives with:: Significant Other Patient language and need for interpreter reviewed:: Yes Do you feel safe going back to the place where you live?: Yes      Need for Family Participation in Patient Care: Yes (Comment) Care giver support system in place?: Yes (comment) Current home services: Home PT, Home OT, Homehealth aide, DME, Home RN Criminal Activity/Legal Involvement Pertinent to Current Situation/Hospitalization: No - Comment as needed  Activities of Daily Living Home Assistive Devices/Equipment: None    Permission Sought/Granted Permission sought to share information with : Case Manager, Neurosurgeon, Family Supports Permission granted to share information with : Yes, Verbal Permission Granted  Share Information with NAME: Girlfirend- MIssy   Permission granted to share info w AGENCY: Home Health Agency and SNF         Emotional Assessment Appearance:: Appears stated age   Affect (typically observed): Accepting, Quiet Orientation: : Oriented to Self, Oriented to Place, Oriented to  Time Alcohol / Substance Use: Not Applicable Psych Involvement: No (comment)  Admission diagnosis:  SOB Patient Active Problem List   Diagnosis Date Noted  . Copious oral secretions 08/25/2018  . Gastrostomy tube in place [facility])   . Protein-calorie malnutrition, severe 08/19/2018  . Pressure injury of skin 08/19/2018  . Retropharyngeal abscess 08/18/2018  . Mediastinitis    PCP:  Preston Fleeting, MD Pharmacy:   [facility] [address], [place] - [address] [place] [address] Phone: [phone] Fax: [phone]     Social Determinants of Health (SDOH) Interventions    Readmission Risk Interventions No flowsheet data found.

## 2018-08-25 NOTE — ED Notes (Signed)
Patient's girlfriend, Donald Abbott [phone]

## 2018-08-25 NOTE — Progress Notes (Signed)
CODE SEPSIS - PHARMACY COMMUNICATION  **Broad Spectrum Antibiotics should be administered within 1 hour of Sepsis diagnosis**  Time Code Sepsis Called/Page Received: @ 0105  Antibiotics Ordered: Cefepime and vancomycin   Time of 1st antibiotic administration: @ 0148  Additional action taken by pharmacy: Spoke with RN  @ [phone] about remaining time to start antibiotics.    Gardner Candle, PharmD, BCPS Clinical Pharmacist 08/25/2018 1:57 AM

## 2018-08-25 NOTE — ED Notes (Signed)
ED Provider at bedside. 

## 2018-08-25 NOTE — Progress Notes (Signed)
Patients BP 93/66. Held patient's coreg and alerted Dr. Nancy Marus.   Madie Reno, RN

## 2018-08-25 NOTE — ED Provider Notes (Signed)
[facility] Emergency Department Provider Note ________________________   First MD Initiated Contact with Patient 08/25/18 [phone]     (approximate)  I have reviewed the triage vital signs and the nursing notes.   HISTORY  Chief Complaint Shortness of Breath    HPI Donald Abbott is a 53 y.o. male with medical history as listed below returns to the emergency department after being discharged yesterday with difficulty breathing, chest pain and  copious secretions from tracheostomy.          Past Medical History:  Diagnosis Date  . COPD (chronic obstructive pulmonary disease) [facility])     Patient Active Problem List   Diagnosis Date Noted  . Gastrostomy tube in place [facility])   . Protein-calorie malnutrition, severe 08/19/2018  . Pressure injury of skin 08/19/2018  . Retropharyngeal abscess 08/18/2018  . Mediastinitis     History reviewed. No pertinent surgical history.  Prior to Admission medications   Medication Sig Start Date End Date Taking? Authorizing Provider  Acetaminophen 650 MG/20.3ML SOLN 20.3 mLs (650 mg total) by Enteral route every 4 (four) hours as needed. 08/21/18   Bettey Costa, MD  albuterol (PROVENTIL) (2.5 MG/3ML) 0.083% nebulizer solution Inhale 3 mLs into the lungs every 6 (six) hours as needed for wheezing or shortness of breath. 08/21/18 08/21/19  Bettey Costa, MD  amiodarone (PACERONE) 200 MG tablet 1 tablet (200 mg total) by G-tube route daily. 08/21/18   Bettey Costa, MD  aspirin 81 MG chewable tablet 1 tablet (81 mg total) by G-tube route daily. 08/18/18 09/17/18  [provider]  budesonide-formoterol (SYMBICORT) 160-4.5 MCG/ACT inhaler Inhale 2 puffs into the lungs 2 (two) times daily. 08/21/18   Bettey Costa, MD  carvedilol (COREG) 3.125 MG tablet 1 tablet (3.125 mg total) by G-tube route Two (2) times a day. 08/21/18   Bettey Costa, MD  ertapenem (INVANZ) IVPB Inject 1 g into the vein daily for 3 days. Indication:   Retropharyngeal and mediastinal abscess,  Culture Result: strep aniginosus, propinobacterium Last Day of Therapy:  08/26/2018 Labs - Once weekly:  CBC/D and BMP, Labs - Every other week:  ESR and CRP 08/23/18 08/26/18  Bettey Costa, MD  fluconazole (DIFLUCAN) 200 MG tablet 2 tablets (400 mg total) by G-tube route daily for 8 days. 08/18/18 08/26/18  [provider]  hydrALAZINE (APRESOLINE) 20 MG/ML injection Inject 1 mL into the vein every 4 (four) hours as needed. (Give for SBP>160 after Labetalol) 08/17/18 09/16/18  [provider]  labetalol (NORMODYNE,TRANDATE) 5 MG/ML injection Inject 4 mLs into the vein every 4 (four) hours as needed. (Give for SBP>160 or HR>100) 08/17/18 09/16/18  [provider]  loperamide (IMODIUM) 2 MG capsule Take 1 capsule by mouth 2 (two) times daily as needed. 08/17/18 09/16/18  [provider]  nutrition supplement, JUVEN, (JUVEN) PACK Place 1 packet into feeding tube 2 (two) times daily between meals. 08/21/18 09/21/18  Bettey Costa, MD  Nutritional Supplements (FEEDING SUPPLEMENT, OSMOLITE 1.5 CAL,) LIQD Place 1,000 mLs into feeding tube continuous for 30 days. 08/21/18 09/20/18  Bettey Costa, MD  OLANZapine (ZYPREXA) 2.5 MG tablet 1 tablet (2.5 mg total) by G-tube route nightly as needed. 08/17/18 09/16/18  [provider]  OLANZapine (ZYPREXA) 2.5 MG tablet Place 1 tablet (2.5 mg total) into feeding tube at bedtime as needed (Insomnia). 08/21/18   Bettey Costa, MD  ondansetron (ZOFRAN) 4 MG/2ML SOLN injection Infuse 2 mL (4 mg total) into a venous catheter every eight (8)  hours as needed. 08/17/18 09/16/18  [provider]  oxyCODONE (ROXICODONE) 5 MG/5ML solution 5 mL (5 mg total) by G-tube route every four (4) hours as needed for up to 5 days. 08/21/18   Bettey Costa, MD  polyethylene glycol (MIRALAX / GLYCOLAX) packet 17 g by G-tube route Two (2) times a day. 08/21/18   Bettey Costa, MD  Psyllium (METAMUCIL FIBER) 51.7 % PACK Take 1  packet by mouth 2 (two) times daily for 30 days. 08/21/18 09/20/18  Bettey Costa, MD  sodium chloride flush 0.9 % SOLN injection Inject into the vein. Infuse 10 mL into a venous catheter every 8 hours. 08/18/18   [provider]  thiamine 100 MG tablet Place 1 tablet (100 mg total) into feeding tube daily. 08/22/18   Bettey Costa, MD  vitamin C (VITAMIN C) 250 MG tablet Place 1 tablet (250 mg total) into feeding tube 2 (two) times daily. 08/21/18   Bettey Costa, MD  Water For Irrigation, Sterile (FREE WATER) SOLN Place 50 mLs into feeding tube every 4 (four) hours. 08/21/18   Bettey Costa, MD    Allergies Other  No family history on file.  Social History Social History   Tobacco Use  . Smoking status: Former Research scientist (life sciences)  . Smokeless tobacco: Never Used  Substance Use Topics  . Alcohol use: Yes  . Drug use: Not on file    Review of Systems Constitutional: No fever/chills Eyes: No visual changes. ENT: No sore throat. Cardiovascular: Positive for chest pain. Respiratory: Positive for shortness of breath. Gastrointestinal: No abdominal pain.  No nausea, no vomiting.  No diarrhea.  No constipation. Genitourinary: Negative for dysuria. Musculoskeletal: Negative for neck pain.  Negative for back pain. Integumentary: Negative for rash. Neurological: Negative for headaches, focal weakness or numbness.   ____________________________________________   PHYSICAL EXAM:  VITAL SIGNS: ED Triage Vitals  Enc Vitals Group     BP 08/25/18 0051 106/70     Pulse Rate 08/25/18 0051 77     Resp 08/25/18 0145 (!) 23     Temp 08/25/18 0051 97.7 F (36.5 C)     Temp Source 08/25/18 0051 Oral     SpO2 08/25/18 0050 99 %     Weight 08/25/18 0053 50 kg (110 lb 3.7 oz)     Height --      Head Circumference --      Peak Flow --      Pain Score 08/25/18 0052 10     Pain Loc --      Pain Edu? --      Excl. in [place]? --     Constitutional: Alert and oriented.  Apparent discomfort and apparent  respiratory difficulty Eyes: Conjunctivae are normal.  Head: Atraumatic. Mouth/Throat: Mucous membranes are moist. Oropharynx non-erythematous. Neck: No stridor.   Cardiovascular: Normal rate, regular rhythm. Good peripheral circulation. Grossly normal heart sounds. Respiratory: Normal respiratory effort.  No retractions.  Bibasilar rhonchi Gastrointestinal: Soft and nontender. No distention.   Musculoskeletal: No lower extremity tenderness nor edema. No gross deformities of extremities. Neurologic:  Normal speech and language. No gross focal neurologic deficits are appreciated.  Skin:  Skin is warm, dry and intact. No rash noted. Psychiatric: Mood and affect are normal. Speech and behavior are normal.  ____________________________________________   LABS (all labs ordered are listed, but only abnormal results are displayed)  Labs Reviewed  COMPREHENSIVE METABOLIC PANEL - Abnormal; Notable for the following components:      Result Value   Sodium  133 (*)    Chloride 93 (*)    Glucose, Bld 102 (*)    Creatinine, Ser 0.53 (*)    Total Protein 8.2 (*)    ALT 50 (*)    All other components within normal limits  CBC WITH DIFFERENTIAL/PLATELET - Abnormal; Notable for the following components:   WBC 12.9 (*)    RBC 4.12 (*)    Hemoglobin 11.6 (*)    HCT 37.3 (*)    Neutro Abs 8.9 (*)    Abs Immature Granulocytes 0.08 (*)    All other components within normal limits  CULTURE, BLOOD (ROUTINE X 2)  CULTURE, BLOOD (ROUTINE X 2)  URINE CULTURE  LACTIC ACID, PLASMA  LACTIC ACID, PLASMA  URINALYSIS, ROUTINE W REFLEX MICROSCOPIC  BRAIN NATRIURETIC PEPTIDE  PROCALCITONIN  BLOOD GAS, VENOUS   ____________________________________________  EKG  ED ECG REPORT I, Alma N BROWN, the attending physician, personally viewed and interpreted this ECG.   Date: 08/25/2018  EKG Time: 1:00AM  Rate: 75  Rhythm: Normal Sinus Rhythm  Axis: Normal  Intervals:Normal  ST&T Change: None   ____________________________________________  RADIOLOGY I, [place] Ernst Bowler, personally viewed and evaluated these images (plain radiographs) as part of my medical decision making, as well as reviewing the written report by the radiologist.  ED MD interpretation:    Official radiology report(s): Dg Chest Port 1 View  Result Date: 08/25/2018 CLINICAL DATA:  Shortness of breath, worsening secretions. Wound VAC on chest. EXAM: PORTABLE CHEST 1 VIEW COMPARISON:  Chest radiograph August 18, 2018 FINDINGS: Cardiomediastinal silhouette is normal. Similar small RIGHT pleural effusion versus pleural thickening with lung base atelectasis/scarring. LEFT lung is clear. Biapical pleural thickening. No pneumothorax. Surgical clips about the lower neck. Tracheostomy tube in place. RIGHT PICC distal tip projects in mid superior vena cava unchanged. Status post median sternotomy. IMPRESSION: 1. Similar small RIGHT pleural effusion versus pleural thickening with underlying atelectasis/scarring. 2. No apparent change in life-support lines. Electronically Signed   By: Elon Alas M.D.   On: 08/25/2018 01:47     Procedures   ____________________________________________   INITIAL IMPRESSION / MDM / ASSESSMENT AND PLAN / ED COURSE  As part of my medical decision making, I reviewed the following data within the [place] was evaluated in Emergency Department on 08/25/2018 for the symptoms described in the history of present illness. He was evaluated in the context of the global COVID-19 pandemic, which necessitated consideration that the patient might be at risk for infection with the SARS-CoV-2 virus that causes COVID-19. Institutional protocols and algorithms that pertain to the evaluation of patients at risk for COVID-19 are in a state of rapid change based on information released by regulatory bodies including the CDC and federal and state organizations. These policies and  algorithms were followed during the patient's care in the ED.     53 year old male presenting with above-stated history and physical exam secondary to respiratory stress and chest discomfort.  Patient underwent suctioning with clear thick secretions extracted.  Patient with improved respiratory status following suctioning.  However chest pain persists.  Patient given IV morphine with some pain improvement however chest pain persists and as such patient discussed with hospitalist for hospital admission further evaluation and management. ____________________________________________  FINAL CLINICAL IMPRESSION(S) / ED DIAGNOSES  Final diagnoses:  SOB (shortness of breath)     MEDICATIONS GIVEN DURING THIS VISIT:  Medications  vancomycin (VANCOCIN) IVPB 1000 mg/200 mL premix (1,000 mg Intravenous  New Bag/Given 08/25/18 0155)  morphine 4 MG/ML injection 4 mg (4 mg Intravenous Given 08/25/18 0150)  ondansetron (ZOFRAN) injection 4 mg (4 mg Intravenous Given 08/25/18 0150)  ceFEPIme (MAXIPIME) 2 g in sodium chloride 0.9 % 100 mL IVPB (0 g Intravenous Stopped 08/25/18 0218)     ED Discharge Orders    None       Note:  This document was prepared using Dragon voice recognition software and may include unintentional dictation errors.   Gregor Hams, MD 08/29/18 [phone]

## 2018-08-25 NOTE — Consult Note (Addendum)
WOC Nurse wound consult note Pt was discharged yesterday afternoon and negative pressure dressing was converted to a Medella machine for home use.  Pt was readmitted last night.  The Medella machine is at the bedside but not turned on.  There is no charger in the room and no battery supply left.  There are also no Medella dressings with track pads compatible with tubing in the room.  When patient is discharged; please contact home health agency and they will need to come and convert pt back to their machine.  WOC team is not available on the weekends and proper supplies are not available in the room.   Pt will be placed back onto a hospital Vac machine and dressing while inpatient per our [facility] protocol.    Wound type: full thickness post op wound to sternum Measurement:15X3.2X.2cm Wound bed: 95% beefy red with 5% yellow, exposed wires Drainage (amount, consistency, odor) no odor, minimal amt yellow drainage Periwound: intact skin surrounding Dressing procedure/placement/frequency: Pt was medicated prior to procedure and tolerated with mod amt discomfort.  Applied one piece black foam to 29mm cont suction. WOC team will plan to change dressing Q M/W/F while patient is in the hospital; Home Health will change after discharge. Cammie Mcgee MSN, RN, CWOCN, [place], CNS [phone]

## 2018-08-25 NOTE — ED Notes (Signed)
RT at bedside - suctioned patient's tracheostomy

## 2018-08-25 NOTE — Progress Notes (Signed)
Spoke extensively with patient's girlfriend Donald Abbott. Miss Mayford Knife has voiced many concerns in regards to the patient's current well being. She is also concerned if patient will be discharged to soon. Relayed to Miss Mayford Knife the current plan of care and at this time there is no notes indicating a specified discharge date.   Madie Reno, RN

## 2018-08-25 NOTE — ED Notes (Signed)
ED TO INPATIENT HANDOFF REPORT  ED Nurse Name and Phone #: Woody Seller Name/Age/Gender Donald Abbott 53 y.o. male Room/Bed: ED06A/ED06A  Code Status   Code Status: Prior  Home/SNF/Other Home Patient oriented to: self, place, time and situation Is this baseline? Yes   Triage Complete: Triage complete  Chief Complaint SOB  Triage Note Pt in via POV with worsening shortness of breath and increased secretions.  Pt with trach.  Also with wound vac attached to chest.   Allergies Allergies  Allergen Reactions  . Other     Dog and cat dander intermittently    Level of Care/Admitting Diagnosis ED Disposition    ED Disposition Condition Comment   Admit  Hospital Area: [facility] [[id number]]  Level of Care: Med-Surg [16]  Diagnosis: Copious oral secretions [6045409]  Admitting Physician: Arnaldo Natal [[id number]]  Attending Physician: Arnaldo Natal [phone]  PT Class (Do Not Modify): Observation [104]  PT Acc Code (Do Not Modify): Observation [10022]       B Medical/Surgery History Past Medical History:  Diagnosis Date  . COPD (chronic obstructive pulmonary disease) (HCC)    History reviewed. No pertinent surgical history.   A IV Location/Drains/Wounds Patient Lines/Drains/Airways Status   Active Line/Drains/Airways    Name:   Placement date:   Placement time:   Site:   Days:   Peripheral IV 08/25/18 Right Forearm   08/25/18    0136    Forearm   less than 1   Peripheral IV 08/25/18 Left Arm   08/25/18    0146    Arm   less than 1   PICC Single Lumen (Ped) 08/18/18 Right Cephalic   08/18/18    0101    Cephalic   7   Negative Pressure Wound Therapy Sternum Medial;Anterior   08/18/18    0059    -   7   Gastrostomy/Enterostomy Gastrostomy LLQ   08/18/18    0058    LLQ   7   Tracheostomy   08/18/18    0105    -   7   Pressure Injury 08/18/18 Stage II -  Partial thickness loss of dermis presenting as a shallow open ulcer with a red, pink  wound bed without slough.   08/18/18    0100     7   Pressure Injury 08/18/18 Stage II -  Partial thickness loss of dermis presenting as a shallow open ulcer with a red, pink wound bed without slough.   08/18/18    0100     7          Intake/Output Last 24 hours No intake or output data in the 24 hours ending 08/25/18 0331  Labs/Imaging Results for orders placed or performed during the hospital encounter of 08/25/18 (from the past 48 hour(s))  Lactic acid, plasma     Status: None   Collection Time: 08/25/18  1:23 AM  Result Value Ref Range   Lactic Acid, Venous 1.6 0.5 - 1.9 mmol/L    Comment: Performed at [facility], [address] [place], [place] [address]  Comprehensive metabolic panel     Status: Abnormal   Collection Time: 08/25/18  1:23 AM  Result Value Ref Range   Sodium 133 (L) 135 - 145 mmol/L   Potassium 4.3 3.5 - 5.1 mmol/L   Chloride 93 (L) 98 - 111 mmol/L   CO2 29 22 - 32 mmol/L   Glucose, Bld 102 (H) 70 -  99 mg/dL   BUN 18 6 - 20 mg/dL   Creatinine, Ser 1.61 (L) 0.61 - 1.24 mg/dL   Calcium 9.5 8.9 - 09.6 mg/dL   Total Protein 8.2 (H) 6.5 - 8.1 g/dL   Albumin 3.7 3.5 - 5.0 g/dL   AST 37 15 - 41 U/L   ALT 50 (H) 0 - 44 U/L   Alkaline Phosphatase 67 38 - 126 U/L   Total Bilirubin 0.7 0.3 - 1.2 mg/dL   GFR calc non Af Amer >60 >60 mL/min   GFR calc Af Amer >60 >60 mL/min   Anion gap 11 5 - 15    Comment: Performed at [facility], [address] [place], [place] [address]  CBC WITH DIFFERENTIAL     Status: Abnormal   Collection Time: 08/25/18  1:23 AM  Result Value Ref Range   WBC 12.9 (H) 4.0 - 10.5 K/uL   RBC 4.12 (L) 4.22 - 5.81 MIL/uL   Hemoglobin 11.6 (L) 13.0 - 17.0 g/dL   HCT 98.1 (L) 19.1 - 47.8 %   MCV 90.5 80.0 - 100.0 fL   MCH 28.2 26.0 - 34.0 pg   MCHC 31.1 30.0 - 36.0 g/dL   RDW 29.5 62.1 - 30.8 %   Platelets 346 150 - 400 K/uL   nRBC 0.0 0.0 - 0.2 %   Neutrophils Relative % 68 %   Neutro Abs 8.9 (H) 1.7 - 7.7 K/uL    Lymphocytes Relative 20 %   Lymphs Abs 2.6 0.7 - 4.0 K/uL   Monocytes Relative 7 %   Monocytes Absolute 1.0 0.1 - 1.0 K/uL   Eosinophils Relative 3 %   Eosinophils Absolute 0.3 0.0 - 0.5 K/uL   Basophils Relative 1 %   Basophils Absolute 0.1 0.0 - 0.1 K/uL   Immature Granulocytes 1 %   Abs Immature Granulocytes 0.08 (H) 0.00 - 0.07 K/uL    Comment: Performed at [facility], [address] [place], [place] [address]  Brain natriuretic peptide     Status: None   Collection Time: 08/25/18  1:23 AM  Result Value Ref Range   B Natriuretic Peptide 40.0 0.0 - 100.0 pg/mL    Comment: Performed at [facility], [address] [place], [place] [address]  Procalcitonin     Status: None   Collection Time: 08/25/18  1:23 AM  Result Value Ref Range   Procalcitonin <0.10 ng/mL    Comment:        Interpretation: PCT (Procalcitonin) <= 0.5 ng/mL: Systemic infection (sepsis) is not likely. Local bacterial infection is possible. (NOTE)       Sepsis PCT Algorithm           Lower Respiratory Tract                                      Infection PCT Algorithm    ----------------------------     ----------------------------         PCT < 0.25 ng/mL                PCT < 0.10 ng/mL         Strongly encourage             Strongly discourage   discontinuation of antibiotics    initiation of antibiotics    ----------------------------     -----------------------------       PCT 0.25 -  0.50 ng/mL            PCT 0.10 - 0.25 ng/mL               OR       >80% decrease in PCT            Discourage initiation of                                            antibiotics      Encourage discontinuation           of antibiotics    ----------------------------     -----------------------------         PCT >= 0.50 ng/mL              PCT 0.26 - 0.50 ng/mL               AND        <80% decrease in PCT             Encourage initiation of                                             antibiotics        Encourage continuation           of antibiotics    ----------------------------     -----------------------------        PCT >= 0.50 ng/mL                  PCT > 0.50 ng/mL               AND         increase in PCT                  Strongly encourage                                      initiation of antibiotics    Strongly encourage escalation           of antibiotics                                     -----------------------------                                           PCT <= 0.25 ng/mL                                                 OR                                        > 80% decrease in PCT  Discontinue / Do not initiate                                             antibiotics Performed at [facility], [address] [place], [place] [address]   Blood gas, venous ([facility], [facility], [facility])     Status: Abnormal   Collection Time: 08/25/18  1:44 AM  Result Value Ref Range   pH, Ven 7.39 7.250 - 7.430   pCO2, Ven 56 44.0 - 60.0 mmHg   pO2, Ven 46.0 (H) 32.0 - 45.0 mmHg   Bicarbonate 33.9 (H) 20.0 - 28.0 mmol/L   Acid-Base Excess 7.1 (H) 0.0 - 2.0 mmol/L   O2 Saturation 81.2 %   Patient temperature 37.0    Collection site VEIN    Sample type VENOUS     Comment: Performed at [facility], [address] [place], [place] [address]   Dg Chest Port 1 View  Result Date: 08/25/2018 CLINICAL DATA:  Shortness of breath, worsening secretions. Wound VAC on chest. EXAM: PORTABLE CHEST 1 VIEW COMPARISON:  Chest radiograph August 18, 2018 FINDINGS: Cardiomediastinal silhouette is normal. Similar small RIGHT pleural effusion versus pleural thickening with lung base atelectasis/scarring. LEFT lung is clear. Biapical pleural thickening. No pneumothorax. Surgical clips about the lower neck. Tracheostomy tube in place. RIGHT PICC distal tip projects in mid superior vena cava unchanged. Status post median sternotomy. IMPRESSION: 1. Similar small  RIGHT pleural effusion versus pleural thickening with underlying atelectasis/scarring. 2. No apparent change in life-support lines. Electronically Signed   By: Awilda Metro M.D.   On: 08/25/2018 01:47    Pending Labs Unresulted Labs (From admission, onward)    Start     Ordered   08/25/18 0102  Blood Culture (routine x 2)  BLOOD CULTURE X 2,   STAT     08/25/18 0102   08/25/18 0102  Urinalysis, Routine w reflex microscopic  ONCE - STAT,   STAT     08/25/18 0102   08/25/18 0102  Urine culture  Add-on,   AD     08/25/18 0102   Signed and Held  Creatinine, serum  (enoxaparin (LOVENOX)    CrCl >/= 30 ml/min)  Weekly,   R    Comments:  while on enoxaparin therapy    Signed and Held          Vitals/Pain Today's Vitals   08/25/18 0145 08/25/18 0200 08/25/18 0205 08/25/18 0230  BP:  105/64  106/66  Pulse: 73 78  77  Resp: (!) 23 (!) 21  13  Temp:      TempSrc:      SpO2: 99% 96%  97%  Weight:      PainSc:   7      Isolation Precautions No active isolations  Medications Medications  glycopyrrolate (ROBINUL) injection 0.1 mg (has no administration in time range)  morphine 2 MG/ML injection (has no administration in time range)  morphine 4 MG/ML injection 4 mg (4 mg Intravenous Given 08/25/18 0150)  ondansetron (ZOFRAN) injection 4 mg (4 mg Intravenous Given 08/25/18 0150)  vancomycin (VANCOCIN) IVPB 1000 mg/200 mL premix (0 mg Intravenous Stopped 08/25/18 0255)  ceFEPIme (MAXIPIME) 2 g in sodium chloride 0.9 % 100 mL IVPB (0 g Intravenous Stopped 08/25/18 0218)  morphine 2 MG/ML injection 2 mg (2 mg Intravenous Given 08/25/18 0325)    Mobility  walks Low fall risk   Focused Assessments Pulmonary Assessment Handoff:  Lung sounds: Bilateral Breath Sounds: Rhonchi O2 Device: Nasal Cannula O2 Flow Rate (L/min): 2 L/min      R Recommendations: See Admitting Provider Note  Report given to:   Additional Notes:

## 2018-08-25 NOTE — ED Triage Notes (Signed)
Pt in via POV with worsening shortness of breath and increased secretions.  Pt with trach.  Also with wound vac attached to chest.

## 2018-08-26 ENCOUNTER — Encounter: Payer: Self-pay | Admitting: Internal Medicine

## 2018-08-26 LAB — BASIC METABOLIC PANEL
Anion gap: 9 (ref 5–15)
BUN: 22 mg/dL — ABNORMAL HIGH (ref 6–20)
CO2: 29 mmol/L (ref 22–32)
Calcium: 9 mg/dL (ref 8.9–10.3)
Chloride: 98 mmol/L (ref 98–111)
Creatinine, Ser: 0.58 mg/dL — ABNORMAL LOW (ref 0.61–1.24)
GFR calc Af Amer: >60 mL/min (ref >60)
GFR calc non Af Amer: >60 mL/min (ref >60)
Glucose, Bld: 111 mg/dL — ABNORMAL HIGH (ref 70–99)
Potassium: 4.5 mmol/L (ref 3.5–5.1)
Sodium: 136 mmol/L (ref 135–145)

## 2018-08-26 LAB — CBC
HCT: 34.1 % — ABNORMAL LOW (ref 39.0–52.0)
Hemoglobin: 10.9 g/dL — ABNORMAL LOW (ref 13.0–17.0)
MCH: 28.1 pg (ref 26.0–34.0)
MCHC: 32 g/dL (ref 30.0–36.0)
MCV: 87.9 fL (ref 80.0–100.0)
Platelets: 318 10*3/uL (ref 150–400)
RBC: 3.88 MIL/uL — ABNORMAL LOW (ref 4.22–5.81)
RDW: 15.3 % (ref 11.5–15.5)
WBC: 10.4 10*3/uL (ref 4.0–10.5)
nRBC: 0 % (ref 0.0–0.2)

## 2018-08-26 MED ORDER — SODIUM CHLORIDE 0.9 % IV SOLN
INTRAVENOUS | Status: DC | PRN
  Administered 2018-08-26 – 2018-08-27 (×2): 250 mL via INTRAVENOUS

## 2018-08-26 NOTE — Progress Notes (Signed)
RN notified MD pt is requesting ice chips. Per MD speech therapy needs to assess pt before giving ice chips. MD is concern of possible aspiration,

## 2018-08-26 NOTE — Progress Notes (Signed)
[employer] - [place] [facility]   PATIENT NAME: Donald Abbott    MR#:  [id number]  DATE OF BIRTH:  1965-11-20  SUBJECTIVE:  CHIEF COMPLAINT:   Chief Complaint  Patient presents with  . Shortness of Breath   No new complaint this morning.  Respiratory secretions from trach gradually improving.  No fevers overnight. Patient communicates by writing.  Has a trach in place.  REVIEW OF SYSTEMS:  Review of Systems  Constitutional: Negative for chills and fever.  HENT: Negative for hearing loss.        Has trach.  Eyes: Negative for blurred vision and double vision.  Respiratory: Positive for sputum production. Negative for cough.   Cardiovascular: Negative for chest pain and palpitations.  Gastrointestinal: Negative for abdominal pain, heartburn, nausea and vomiting.  Genitourinary: Negative for dysuria and urgency.  Musculoskeletal: Negative for neck pain.       Low back pain.  Skin: Negative for itching and rash.  Neurological: Negative for dizziness and headaches.  Psychiatric/Behavioral: Negative for depression and hallucinations.    DRUG ALLERGIES:   Allergies  Allergen Reactions  . Chlorhexidine Other (See Comments)    excoriates  . Other     Dog and cat dander intermittently   VITALS:  Blood pressure 104/64, pulse 73, temperature 97.8 F (36.6 C), temperature source Oral, resp. rate 18, height 5\' 8"  (1.727 m), weight 47.4 kg, SpO2 99 %. PHYSICAL EXAMINATION:  Physical Exam  Constitutional: He is oriented to person, place, and time. He appears well-developed.  HENT:  Head: Normocephalic and atraumatic.  Right Ear: External ear normal.  Eyes: Pupils are equal, round, and reactive to light. Conjunctivae are normal.  Neck: Normal range of motion. No thyromegaly present.  Has trach in place.  Cardiovascular: Normal rate, regular rhythm and normal heart sounds.  Respiratory: Effort normal and breath sounds normal. No respiratory distress.  GI:  Soft. Bowel sounds are normal. There is no abdominal tenderness.  Musculoskeletal: Normal range of motion.        General: No edema.  Neurological: He is alert and oriented to person, place, and time.  Communicates by writing  Skin: Skin is warm. He is not diaphoretic. No erythema.  Psychiatric: He has a normal mood and affect. His behavior is normal.    LABORATORY PANEL:  Male CBC Recent Labs  Lab 08/26/18 0350  WBC 10.4  HGB 10.9*  HCT 34.1*  PLT 318   ------------------------------------------------------------------------------------------------------------------ Chemistries  Recent Labs  Lab 08/25/18 0123 08/26/18 0350  NA 133* 136  K 4.3 4.5  CL 93* 98  CO2 29 29  GLUCOSE 102* 111*  BUN 18 22*  CREATININE 0.53* 0.58*  CALCIUM 9.5 9.0  AST 37  --   ALT 50*  --   ALKPHOS 67  --   BILITOT 0.7  --    RADIOLOGY:  No results found. ASSESSMENT AND PLAN:   This is a 53 year old male admitted for difficulty handling oral secretions.  1. Oral secretions: copious. Doubtful aspiration as the patient is fully cognizant of this problem and was aware of it at the time of discharge recently .  Patient already started on glycopyrrolate via PEG tube and respiratory secretions appears to be improving.   2. Tracheostomy: secure, dressed appropriately. When suctioned appropriately his work of breathing is normal, thus the tracheostomy appears to be sized appropriately.  No valve in place which makes it difficult for the patient to speak. Patient is capable  of writing down his wishes  3.  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  Patient currently on IV antibiotics with IV Invanz.  Notified that patient should be completing IV Invanz today.  Has a PICC line which will be discontinued prior to discharge from the hospital.  Leukocytosis appear resolved.  No fevers overnight. So far no evidence of pneumonia   When patient is discharged; please contact home health agency and  they will need to come and convert pt back to their machine.  WOC team will plan to change dressing Q M/W/F while patient is in the hospital; Home Health will change after discharge.  4. Gastrostomy: continue tube feeds  5. 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   6.  DVT prophylaxis: Lovenox  7.  Sacral decubitus ulcer  DVT prophylaxis; Lovenox  Disposition; anticipate discharge tomorrow if patient remains afebrile and clinically stable  All the records are reviewed and case discussed with Care Management/Social Worker. Management plans discussed with the patient, family and they are in agreement.  CODE STATUS: Full Code  TOTAL TIME TAKING CARE OF THIS PATIENT: 36 minutes.   More than 50% of the time was spent in counseling/coordination of care: YES  POSSIBLE D/C IN 1 DAY, DEPENDING ON CLINICAL CONDITION.   Parks Czajkowski M.D on 08/26/2018 at 10:26 AM  Between 7am to 6pm - Pager - [phone]  After 6pm go to [email or web address] - Social research officer, government  [facility] [place] [facility]  Office  [phone]  CC: Primary care physician; Preston Fleeting, MD  Note: This dictation was prepared with Dragon dictation along with smaller phrase technology. Any transcriptional errors that result from this process are unintentional.

## 2018-08-27 ENCOUNTER — Observation Stay: Payer: Medicaid Other

## 2018-08-27 DIAGNOSIS — Z7982 Long term (current) use of aspirin: Secondary | ICD-10-CM | POA: Diagnosis not present

## 2018-08-27 DIAGNOSIS — R0602 Shortness of breath: Secondary | ICD-10-CM | POA: Diagnosis not present

## 2018-08-27 DIAGNOSIS — J449 Chronic obstructive pulmonary disease, unspecified: Secondary | ICD-10-CM | POA: Diagnosis present

## 2018-08-27 DIAGNOSIS — Z87891 Personal history of nicotine dependence: Secondary | ICD-10-CM | POA: Diagnosis not present

## 2018-08-27 DIAGNOSIS — Z7951 Long term (current) use of inhaled steroids: Secondary | ICD-10-CM | POA: Diagnosis not present

## 2018-08-27 DIAGNOSIS — J3800 Paralysis of vocal cords and larynx, unspecified: Secondary | ICD-10-CM | POA: Diagnosis present

## 2018-08-27 DIAGNOSIS — K117 Disturbances of salivary secretion: Secondary | ICD-10-CM | POA: Diagnosis present

## 2018-08-27 DIAGNOSIS — Z888 Allergy status to other drugs, medicaments and biological substances status: Secondary | ICD-10-CM | POA: Diagnosis not present

## 2018-08-27 DIAGNOSIS — Z79899 Other long term (current) drug therapy: Secondary | ICD-10-CM | POA: Diagnosis not present

## 2018-08-27 DIAGNOSIS — Z931 Gastrostomy status: Secondary | ICD-10-CM | POA: Diagnosis not present

## 2018-08-27 DIAGNOSIS — R0682 Tachypnea, not elsewhere classified: Secondary | ICD-10-CM | POA: Diagnosis present

## 2018-08-27 DIAGNOSIS — Z930 Tracheostomy status: Secondary | ICD-10-CM | POA: Diagnosis not present

## 2018-08-27 DIAGNOSIS — L89153 Pressure ulcer of sacral region, stage 3: Secondary | ICD-10-CM | POA: Diagnosis present

## 2018-08-27 LAB — URINE CULTURE: Culture: <10000 — AB

## 2018-08-27 MED ORDER — OXYCODONE HCL 5 MG/5ML PO SOLN
5.0000 mg | ORAL | Status: DC | PRN
  Administered 2018-08-27 – 2018-08-29 (×8): 10 mg
  Filled 2018-08-27 (×10): qty 10

## 2018-08-27 NOTE — Progress Notes (Signed)
[employer] - [place] at [facility]   PATIENT NAME: Donald Abbott    MR#:  [id number]  DATE OF BIRTH:  October 23, 1965  SUBJECTIVE:  CHIEF COMPLAINT:   Chief Complaint  Patient presents with  . Shortness of Breath   This morning patient complained of not feeling too well.  Felt a little short of breath.  Oxygen saturation was 100%.  Stat chest x-ray done this morning.   No fevers.  Decreased secretions reported by nursing staff.  Patient has trach in place.     REVIEW OF SYSTEMS:  Review of Systems  Constitutional: Negative for chills and fever.  HENT: Negative for hearing loss.        Has trach.  Eyes: Negative for blurred vision and double vision.  Respiratory: Negative for cough and sputum production.        Sputum secretions decreased  Cardiovascular: Negative for chest pain and palpitations.  Gastrointestinal: Negative for abdominal pain, heartburn, nausea and vomiting.  Genitourinary: Negative for dysuria and urgency.  Musculoskeletal: Negative for neck pain.  Skin: Negative for itching and rash.  Neurological: Negative for dizziness and headaches.  Psychiatric/Behavioral: Negative for depression and hallucinations.    DRUG ALLERGIES:   Allergies  Allergen Reactions  . Chlorhexidine Other (See Comments)    excoriates  . Other     Dog and cat dander intermittently   VITALS:  Blood pressure 109/71, pulse 83, temperature (!) 97.5 F (36.4 C), temperature source Oral, resp. rate 19, height 5\' 8"  (1.727 m), weight 47.4 kg, SpO2 100 %. PHYSICAL EXAMINATION:  Physical Exam  Constitutional: He is oriented to person, place, and time. He appears well-developed.  HENT:  Head: Normocephalic and atraumatic.  Right Ear: External ear normal.  Eyes: Pupils are equal, round, and reactive to light. Conjunctivae are normal.  Neck: Normal range of motion. No thyromegaly present.  Has trach in place.  Cardiovascular: Normal rate, regular rhythm and normal heart  sounds.  Respiratory: Effort normal and breath sounds normal. No respiratory distress.  GI: Soft. Bowel sounds are normal. There is no abdominal tenderness.  Musculoskeletal: Normal range of motion.        General: No edema.  Neurological: He is alert and oriented to person, place, and time.  Communicates by writing  Skin: Skin is warm. He is not diaphoretic. No erythema.  Psychiatric: He has a normal mood and affect. His behavior is normal.    LABORATORY PANEL:  Male CBC Recent Labs  Lab 08/26/18 0350  WBC 10.4  HGB 10.9*  HCT 34.1*  PLT 318   ------------------------------------------------------------------------------------------------------------------ Chemistries  Recent Labs  Lab 08/25/18 0123 08/26/18 0350  NA 133* 136  K 4.3 4.5  CL 93* 98  CO2 29 29  GLUCOSE 102* 111*  BUN 18 22*  CREATININE 0.53* 0.58*  CALCIUM 9.5 9.0  AST 37  --   ALT 50*  --   ALKPHOS 67  --   BILITOT 0.7  --    RADIOLOGY:  Dg Chest 1 View  Result Date: 08/27/2018 CLINICAL DATA:  Worsening shortness of breath. COPD. Retropharyngeal abscess. EXAM: CHEST  1 VIEW COMPARISON:  08/25/2018 FINDINGS: Tracheostomy tube and right arm PICC line remain in appropriate position. No pneumothorax visualized. Small right pleural effusion mild right basilar subsegmental atelectasis show no significant change. No evidence of pulmonary consolidation or edema. Heart size is within normal limits. Prior median sternotomy. IMPRESSION: No significant change in small right pleural effusion and right basilar subsegmental atelectasis.  Electronically Signed   By: Myles Rosenthal M.D.   On: 08/27/2018 09:50   ASSESSMENT AND PLAN:   This is a 53 year old male admitted for difficulty handling oral secretions.  1. Oral secretions: copious. Doubtful aspiration as the patient is fully cognizant of this problem and was aware of it at the time of discharge recently .  Patient already started on glycopyrrolate via PEG tube and  respiratory secretions appears improved  Patient complained of feeling slightly short of breath this morning.  Oxygen saturation was 100%.  Stat chest x-ray read as stable compared to recent x-ray.  No pulmonary consolidation noted.  2. Tracheostomy: secure, dressed appropriately. When suctioned appropriately his work of breathing is normal, thus the tracheostomy appears to be sized appropriately.  No valve in place which makes it difficult for the patient to speak. Patient is capable of writing down his wishes  3.  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  Patient completed treatment duration with IV Invanz yesterday.   To discontinue PICC line prior to discharge from the hospital in a.m.   Leukocytosis resolved.  No fevers overnight. So far no evidence of pneumonia .  Repeat chest x-ray this morning with no evidence of pulmonary consolidation. When patient is discharged; please contact home health agency and they will need to come and convert pt back to their machine.  WOC team to change dressing tomorrow prior to discharge from the hospital. Home Health will change after discharge. Patient on PRN oxycodone for pain control from the pressure from the wound VAC on the chest.  4. Gastrostomy: continue tube feeds  5. 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   6.  DVT prophylaxis: Lovenox  7.  Sacral decubitus ulcer  DVT prophylaxis; Lovenox  Disposition; anticipate discharge tomorrow if patient remains afebrile and clinically stable  All the records are reviewed and case discussed with Care Management/Social Worker. Management plans discussed with the patient, family and they are in agreement.  CODE STATUS: Full Code  TOTAL TIME TAKING CARE OF THIS PATIENT: .   More than 50% of the time was spent in counseling/coordination of care: YES  POSSIBLE D/C IN 1 DAY, DEPENDING ON CLINICAL CONDITION.   Reggie Welge M.D on 08/27/2018 at  10:03 AM  Between 7am to 6pm - Pager - [phone]  After 6pm go to [email or web address] - Social research officer, government  [facility] [place] [facility]  Office  [phone]  CC: Primary care physician; Preston Fleeting, MD  Note: This dictation was prepared with Dragon dictation along with smaller phrase technology. Any transcriptional errors that result from this process are unintentional.

## 2018-08-28 MED ORDER — ENOXAPARIN SODIUM 30 MG/0.3ML ~~LOC~~ SOLN
30.0000 mg | SUBCUTANEOUS | Status: DC
  Administered 2018-08-28: 30 mg via SUBCUTANEOUS
  Filled 2018-08-28: qty 0.3

## 2018-08-28 MED ORDER — GLYCOPYRROLATE 1 MG PO TABS: 1.0000 mg | ORAL_TABLET | Freq: Two times a day (BID) | ORAL | 0 refills | Status: AC

## 2018-08-28 NOTE — Progress Notes (Signed)
Anticoagulation monitoring(Lovenox):  53yo  M ordered Lovenox 40 mg Q24h  Filed Weights   08/25/18 0451 08/26/18 0355 08/28/18 0415  Weight: 104 lb 8 oz (47.4 kg) 104 lb 8 oz (47.4 kg) 110 lb 7.2 oz (50.1 kg)   BMI 16.8  Lab Results  Component Value Date   CREATININE 0.58 (L) 08/26/2018   CREATININE 0.53 (L) 08/25/2018   CREATININE 0.52 (L) 08/19/2018   Estimated Creatinine Clearance: 77.4 mL/min (A) (by C-G formula based on SCr of 0.58 mg/dL (L)). Hemoglobin & Hematocrit     Component Value Date/Time   HGB 10.9 (L) 08/26/2018 0350   HCT 34.1 (L) 08/26/2018 0350     Per Protocol for Patient with estCrcl > 30 ml/min and weight < 57 kg in male patient,  will transition to Lovenox 30 mg Q24h      Bari Mantis PharmD Clinical Pharmacist 08/28/2018

## 2018-08-28 NOTE — Progress Notes (Signed)
SLP Cancellation Note  Patient Details Name: Donald Abbott MRN: [id number] DOB: [birth year]   Cancelled treatment:       Reason Eval/Treat Not Completed: (chart reviewed; consulted MD re: pt's status). Discussed w/ MD that pt is strictly NPO w/ TFs via PEG post aspiration post repeat MBSS last week. Pt was seen for swallowing exercises until d/c last admission last week. Pt is recommended to f/u w/ ENT for direct view assessment of his vocal cord function d/t report of bilateral VC paralysis by [facility] during his hospitalization there.  Recommend continue w/ his swallowing exs. w/ Home Health svcs at D/C w/ consideration of a repeat MBSS in ~4-5 weeks post therapy time. This will be addressed w/ pt as well. Recommend frequent oral care via swabs whiel admitted. MD/NSG agreed.     [name], MS, CCC-SLP Watson,Katherine 08/28/2018, 3:56 PM

## 2018-08-28 NOTE — Progress Notes (Signed)
[facility] - [place] at [facility]   PATIENT NAME: Donald Abbott    MR#:  [id number]  DATE OF BIRTH:  06/19/1965  SUBJECTIVE:  CHIEF COMPLAINT:   Chief Complaint  Patient presents with  . Shortness of Breath   No new complaint this morning.  No fevers.  No shortness of breath. Decreased secretions reported by nursing staff.  Patient has trach in place.     REVIEW OF SYSTEMS:  Review of Systems  Constitutional: Negative for chills and fever.  HENT: Negative for hearing loss.        Has trach.  Eyes: Negative for blurred vision and double vision.  Respiratory: Negative for cough and sputum production.        Sputum secretions decreased  Cardiovascular: Negative for chest pain and palpitations.  Gastrointestinal: Negative for abdominal pain, heartburn, nausea and vomiting.  Genitourinary: Negative for dysuria and urgency.  Musculoskeletal: Negative for neck pain.  Skin: Negative for itching and rash.  Neurological: Negative for dizziness and headaches.  Psychiatric/Behavioral: Negative for depression and hallucinations.    DRUG ALLERGIES:   Allergies  Allergen Reactions  . Chlorhexidine Other (See Comments)    excoriates  . Other     Dog and cat dander intermittently   VITALS:  Blood pressure 111/64, pulse 84, temperature 98.4 F (36.9 C), temperature source Oral, resp. rate 18, height 5\' 8"  (1.727 m), weight 50.1 kg, SpO2 100 %. PHYSICAL EXAMINATION:  Physical Exam  Constitutional: He is oriented to person, place, and time. He appears well-developed.  HENT:  Head: Normocephalic and atraumatic.  Right Ear: External ear normal.  Eyes: Pupils are equal, round, and reactive to light. Conjunctivae are normal.  Neck: Normal range of motion. No thyromegaly present.  Has trach in place.  Cardiovascular: Normal rate, regular rhythm and normal heart sounds.  Respiratory: Effort normal and breath sounds normal. No respiratory distress.  GI: Soft. Bowel  sounds are normal. There is no abdominal tenderness.  Musculoskeletal: Normal range of motion.        General: No edema.  Neurological: He is alert and oriented to person, place, and time.  Communicates by writing  Skin: Skin is warm. He is not diaphoretic. No erythema.  Psychiatric: He has a normal mood and affect. His behavior is normal.    LABORATORY PANEL:  Male CBC Recent Labs  Lab 08/26/18 0350  WBC 10.4  HGB 10.9*  HCT 34.1*  PLT 318   ------------------------------------------------------------------------------------------------------------------ Chemistries  Recent Labs  Lab 08/25/18 0123 08/26/18 0350  NA 133* 136  K 4.3 4.5  CL 93* 98  CO2 29 29  GLUCOSE 102* 111*  BUN 18 22*  CREATININE 0.53* 0.58*  CALCIUM 9.5 9.0  AST 37  --   ALT 50*  --   ALKPHOS 67  --   BILITOT 0.7  --    RADIOLOGY:  No results found. ASSESSMENT AND PLAN:   This is a 53 year old male admitted for difficulty handling oral secretions.  1. Oral secretions: copious. Doubtful aspiration as the patient is fully cognizant of this problem and was aware of it at the time of discharge recently .  Patient already started on glycopyrrolate via PEG tube and respiratory secretions appears improved  Patient complained of feeling slightly short of breath this morning.  Oxygen saturation was 100%.  Follow-up chest x-ray read as stable compared to recent x-ray.  No pulmonary consolidation noted.   2. Tracheostomy: secure, dressed appropriately. When suctioned appropriately  his work of breathing is normal, thus the tracheostomy appears to be sized appropriately.  No valve in place which makes it difficult for the patient to speak. Patient is capable of writing down his wishes Patient was initially requesting for ice chips.  Seen by speech therapist.  Recommended against ice chips for now until patient is seen by home health speech therapist for closer monitoring.  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  3.  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  Patient completed treatment duration with IV Invanz during this admission.   To discontinue PICC line prior to discharge from the hospital .   Leukocytosis resolved.  No fevers overnight. So far no evidence of pneumonia .  Repeat chest x-ray this morning with no evidence of pulmonary consolidation. When patient is discharged; please contact home health agency and they will need to come and convert pt back to their machine.  WOC team to change dressing today . Home Health will change after discharge. Patient on PRN oxycodone for pain control from the pressure from the wound VAC on the chest.  4. Gastrostomy: continue tube feeds  5. 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   6.  DVT prophylaxis: Lovenox  7.  Sacral decubitus ulcer  DVT prophylaxis; Lovenox  Disposition; discussed extensively with patient's girlfriend who is very concerned about taking patient home.  She needs to be educated by home health agency on how to care for patients PEG tube and trach.  Home health nurse to manage the wound VAC to the chest. Case manager to coordinate with patient's girlfriend regarding safe discharge home with home health services.  Patient currently does not have insurance for placement. I updated girlfriend on patient's clinical condition and all questions were answered.  All the records are reviewed and case discussed with Care Management/Social Worker. Management plans discussed with the patient, family and they are in agreement.  CODE STATUS: Full Code  TOTAL TIME TAKING CARE OF THIS PATIENT: .   More than 50% of the time was spent in counseling/coordination of care: YES  POSSIBLE D/C IN 1 DAY, DEPENDING ON CLINICAL CONDITION.   Coleby Yett M.D on 08/28/2018 at 10:55 AM  Between  7am to 6pm - Pager - [phone]  After 6pm go to [email or web address] - Social research officer, government  [facility] [place] [facility]  Office  [phone]  CC: Primary care physician; Preston Fleeting, MD  Note: This dictation was prepared with Dragon dictation along with smaller phrase technology. Any transcriptional errors that result from this process are unintentional.

## 2018-08-28 NOTE — Consult Note (Addendum)
WOC in for NPWT dressing change to the sternal wound. The bedside nurse has reported that [facility] will not come to hook this patient up to his home unit. They do not want the NPWT dressing changed because it needs to be hooked to the home unit and the [facility] nurse team is not able to do that. The WOC nurse has verified that the Medela unit in the room has no battery charge and will not work currently. I have contacted the patient's girlfriend and requested that she bring the patient's charger to the hospital. With current visitor restrictions I will meet Missy at the medical mall entrance to get the charger.  I will clean the charger with bleach wipe prior to bringing this into the hospital.   Bedside nurse reports that she will change dressing to Medela dressing and hook to home unit at the time of DC.  Donald Abbott [facility], CNS, [employer] [phone]

## 2018-08-29 MED ORDER — ONDANSETRON HCL 4 MG/5ML PO SOLN: 4.0000 mg | Freq: Three times a day (TID) | ORAL | 0 refills | Status: AC | PRN

## 2018-08-29 MED ORDER — OXYCODONE HCL 5 MG/5ML PO SOLN: 5.0000 mg | ORAL | 0 refills | Status: AC | PRN

## 2018-08-29 NOTE — TOC Transition Note (Signed)
Transition of Care [facility]) - CM/SW Discharge Note   Patient Details  Name: Donald Abbott MRN: [id number] Date of Birth: [birth year]  Transition of Care [facility]) CM/SW Contact:  Donald Spaniel, Donald Abbott Phone Number: 08/29/2018, 3:03 PM   Clinical Narrative:   Patient is to discharge today to return home to resume home health through [facility]. Much time was spent on the phone discussing discharge planning and coordinating care for patient with the sister: Donald Abbott: [phone]. CSW coordinated for patient's girlfriend and sister to come in today to the hospital and be taught how to administer the tube feeds. CSW confirmed that the feeding supplies were all at the home. Patient's sister states that they all know how to suction patient. The wound vac will be managed by home health. CSW coordinated with Donald Abbott with [facility] for them to come out to the home starting tomorrow morning. Donald Abbott called the sister personally to inform her. Patient's new medication will be free through the [facility].      Barriers to Discharge: English as a second language teacher, Continued Medical Work up   Patient Goals and CMS Choice        Discharge Placement                       Discharge Plan and Services                          Social Determinants of Health (SDOH) Interventions     Readmission Risk Interventions Readmission Risk Prevention Plan 08/29/2018  Transportation Screening Complete  Home Care Screening Complete  Some recent data might be hidden

## 2018-08-29 NOTE — Progress Notes (Addendum)
Augmentin azithromycin are clear suspect right  [employer] - [place] at [facility]   PATIENT NAME: Donald Abbott    MR#:  [id number]  DATE OF BIRTH:  1966-03-24  SUBJECTIVE:  CHIEF COMPLAINT:   Chief Complaint  Patient presents with  . Shortness of Breath   No new complaint this morning.  No fevers.  No shortness of breath.  Secretions significantly decreased with treatment with glycopyrrolate.  Patient has trach in place.     REVIEW OF SYSTEMS:  Review of Systems  Constitutional: Negative for chills and fever.  HENT: Negative for hearing loss.        Has trach.  Eyes: Negative for blurred vision and double vision.  Respiratory: Negative for cough and sputum production.        Sputum secretions decreased  Cardiovascular: Negative for chest pain and palpitations.  Gastrointestinal: Negative for abdominal pain, heartburn, nausea and vomiting.  Genitourinary: Negative for dysuria and urgency.  Musculoskeletal: Negative for neck pain.  Skin: Negative for itching and rash.  Neurological: Negative for dizziness and headaches.  Psychiatric/Behavioral: Negative for depression and hallucinations.    DRUG ALLERGIES:   Allergies  Allergen Reactions  . Chlorhexidine Other (See Comments)    excoriates  . Other     Dog and cat dander intermittently   VITALS:  Blood pressure 103/60, pulse 71, temperature 98.4 F (36.9 C), temperature source Oral, resp. rate 20, height 5\' 8"  (1.727 m), weight 49.1 kg, SpO2 97 %. PHYSICAL EXAMINATION:  Physical Exam  Constitutional: He is oriented to person, place, and time. He appears well-developed.  HENT:  Head: Normocephalic and atraumatic.  Right Ear: External ear normal.  Eyes: Pupils are equal, round, and reactive to light. Conjunctivae are normal.  Neck: Normal range of motion. No thyromegaly present.  Has trach in place.  Cardiovascular: Normal rate, regular rhythm and normal heart sounds.  Respiratory: Effort normal  and breath sounds normal. No respiratory distress.  GI: Soft. Bowel sounds are normal. There is no abdominal tenderness.  Musculoskeletal: Normal range of motion.        General: No edema.  Neurological: He is alert and oriented to person, place, and time.  Communicates by writing  Skin: Skin is warm. He is not diaphoretic. No erythema.  Psychiatric: He has a normal mood and affect. His behavior is normal.    LABORATORY PANEL:  Male CBC Recent Labs  Lab 08/26/18 0350  WBC 10.4  HGB 10.9*  HCT 34.1*  PLT 318   ------------------------------------------------------------------------------------------------------------------ Chemistries  Recent Labs  Lab 08/25/18 0123 08/26/18 0350  NA 133* 136  K 4.3 4.5  CL 93* 98  CO2 29 29  GLUCOSE 102* 111*  BUN 18 22*  CREATININE 0.53* 0.58*  CALCIUM 9.5 9.0  AST 37  --   ALT 50*  --   ALKPHOS 67  --   BILITOT 0.7  --    RADIOLOGY:  No results found. ASSESSMENT AND PLAN:   This is a 53 year old male admitted for difficulty handling oral secretions.  1. Oral secretions: copious. Doubtful aspiration as the patient is fully cognizant of this problem and was aware of it at the time of discharge recently .  Patient already started on glycopyrrolate via PEG tube and respiratory secretions significantly decreased    Oxygen saturation was 99%.  Follow-up chest x-ray read as stable compared to recent x-ray.  No pulmonary consolidation noted.  2. Tracheostomy: secure, dressed appropriately. When suctioned appropriately his work  of breathing is normal, thus the tracheostomy appears to be sized appropriately.  No valve in place which makes it difficult for the patient to speak. Patient is capable of writing down his wishes Patient was initially requesting for ice chips.  Seen by speech therapist.  Recommended against ice chips for now until patient is seen by home health speech therapist for closer monitoring.  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  3.  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  Patient completed treatment duration with IV Invanz during this admission.   To discontinue PICC line prior to discharge from the hospital .   Leukocytosis resolved.  No fevers overnight. So far no evidence of pneumonia .  Repeat chest x-ray recently with no evidence of pulmonary consolidation. When patient is discharged; please contact home health agency and they will need to come and convert pt back to their machine.  WOC team to change dressing yesterday.  Being changed on Mondays , Wednesdays and Fridays . Home Health will change after discharge. Patient on PRN oxycodone for pain control from the pressure from the wound VAC on the chest.  4. Gastrostomy: continue tube feeds  5. 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   6.  DVT prophylaxis: Lovenox  7.  Sacral decubitus ulcer  DVT prophylaxis; Lovenox  Disposition; discussed extensively with patient's sister this morning who was very concerned about taking patient home.  She was requesting to have appropriate and adequate education regarding care of the patient at home before discharge home.  Case manager discussed at length and was able to coordinate with family regarding safe discharge planning.  They will be giving some education in the hospital regarding care of the patient prior to discharge home today.  Home health also will see patient tomorrow.  All the records are reviewed and case discussed with Care Management/Social Worker. Management plans discussed with the patient, family and they are in agreement.  CODE STATUS: Full Code  TOTAL TIME TAKING CARE OF THIS PATIENT: 28minutes.   More than 50% of the time was spent in counseling/coordination of care: YES  POSSIBLE D/C IN 1 DAY, DEPENDING ON CLINICAL CONDITION.   Donald Abbott M.D on 08/29/2018 at 12:21 PM  Between 7am to 6pm - Pager - [phone]  After 6pm go to [email or web address] - Social research officer, governmentpassword EPAS [facility]  [facility] [place] [facility]  Office  [phone]  CC: Primary care physician; Preston Fleetingevelo, Adrian Mancheno, MD  Note: This dictation was prepared with Dragon dictation along with smaller phrase technology. Any transcriptional errors that result from this process are unintentional.

## 2018-08-29 NOTE — TOC Transition Note (Signed)
Transition of Care [facility]) - CM/SW Discharge Note   Patient Details  Name: BARTOSZ STRENGER MRN: [id number] Date of Birth: [birth year]  Transition [facility] Care [facility] CM/SW Contact:  Chapman Fitch, RN Phone Number: 08/29/2018, 4:36 PM   Clinical Narrative:      RNCM confirmed that Robinul has been delivered from Medication Management  To patients room for discharge.  $0 for patient.    Barriers to Discharge: English as a second language teacher, Continued Medical Work up   Patient Goals and CMS Choice        Discharge Placement                       Discharge Plan and Services                          Social Determinants of Health (SDOH) Interventions     Readmission Risk Interventions Readmission Risk Prevention Plan 08/29/2018  Transportation Screening Complete  Home Care Screening Complete  Some recent data might be hidden

## 2018-08-29 NOTE — Discharge Summary (Signed)
[employer] [facility] [place] at [facility]   PATIENT NAME: Donald Abbott    MR#:  [id number]  DATE OF BIRTH:  [birth year]  DATE OF ADMISSION:  08/25/2018   ADMITTING PHYSICIAN: Arnaldo Natal, MD  DATE OF DISCHARGE: No discharge date for patient encounter.  PRIMARY CARE PHYSICIAN: Revelo, Presley Raddle, MD   ADMISSION DIAGNOSIS:  SOB DISCHARGE DIAGNOSIS:  Active Problems:   Copious oral secretions   SOB (shortness of breath)  SECONDARY DIAGNOSIS:   Past Medical History:  Diagnosis Date  . COPD (chronic obstructive pulmonary disease) (HCC)   . 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 The patient had been recovering well and was discharged home 1 day prior to this admission. However he soon realized that he did not know how to use his suction and developed copious secretions.  The patient was having trouble catching his breath and admits to cough due to attempts at clearing phlegm.  Initially the patient was found to be tachypneic but chest x-ray appeared unchanged from previous.  Due to the need to monitor airway status as well as to put in place home care needs the emergency department staff, hospitalist service for admission.  Hospital course; 1. Oral secretions: copious. Doubtful aspiration as the patient is fully cognizant of this problem and was aware of it at the time of discharge recently .  Patient already started on glycopyrrolate via PEG tube and respiratory secretions significantly decreased   Oxygen saturation was 99%.  Follow-up chest x-ray read as stable compared to recent x-ray.  No pulmonary consolidation noted.  2. Tracheostomy: secure, dressed appropriately. When suctioned appropriately his work of breathing is normal, thus the tracheostomy appears to be sized  appropriately. No valve in place which makes it difficult for the patient to speak. Patient is capable of writing down his wishes Patient was initially requesting for ice chips.  Seen by speech therapist.  Recommended against ice chips for now until patient is seen by home health speech therapist for closer monitoring.  Patient to follow-up with ENT physicians at [facility] post discharge from the hospital for ongoing monitoring of his vocal cord paralysis.  3.  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  Patient completed treatment duration with IV Invanz during this admission.   To discontinue PICC line prior to discharge from the hospital .   Leukocytosis resolved.  No fevers overnight. So far no evidence of pneumonia .  Repeat chest x-ray recently with no evidence of pulmonary consolidation. When patient is discharged; please contact home health agency and they will need to come and convertpt backto their machine. WOC team to change dressing yesterday.  Home Health nurse will change after discharge at patient's house.  They will see patient tomorrow. Patient on PRN oxycodone for pain control from the pressure from the wound VAC on the chest.  4. Gastrostomy: continue tube feeds  5. 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   6.  Sacral decubitus ulcer; stage II-III; present on admission Home health nurse to continue managing post discharge from the hospital.  Disposition; discussed extensively with patient's sister this morning who was very concerned about taking patient home.  She was requesting to have appropriate and adequate education regarding care of the patient at home before discharge home.  Case manager discussed at  length and was able to coordinate with family regarding safe discharge planning.    Family agrees to come into the hospital for further education prior to discharge home today.  Home health agency to see patient tomorrow  morning as well. They agree with discharge and treatment plans as outlined above.  DISCHARGE CONDITIONS:  Stable CONSULTS OBTAINED:   DRUG ALLERGIES:   Allergies  Allergen Reactions  . Chlorhexidine Other (See Comments)    excoriates  . Other     Dog and cat dander intermittently   DISCHARGE MEDICATIONS:   Allergies as of 08/29/2018      Reactions   Chlorhexidine Other (See Comments)   excoriates   Other    Dog and cat dander intermittently      Medication List    STOP taking these medications   ertapenem  IVPB Commonly known as:  INVANZ   fluconazole 200 MG tablet Commonly known as:  DIFLUCAN   hydrALAZINE 20 MG/ML injection Commonly known as:  APRESOLINE   labetalol 5 MG/ML injection Commonly known as:  NORMODYNE,TRANDATE   sodium chloride flush 0.9 % Soln injection     TAKE these medications   Acetaminophen 650 MG/20.3ML Soln 20.3 mLs (650 mg total) by Enteral route every 4 (four) hours as needed.   albuterol (2.5 MG/3ML) 0.083% nebulizer solution Commonly known as:  PROVENTIL Inhale 3 mLs into the lungs every 6 (six) hours as needed for wheezing or shortness of breath.   amiodarone 200 MG tablet Commonly known as:  PACERONE 1 tablet (200 mg total) by G-tube route daily.   ascorbic acid 250 MG tablet Commonly known as:  VITAMIN C Place 1 tablet (250 mg total) into feeding tube 2 (two) times daily.   aspirin 81 MG chewable tablet 1 tablet (81 mg total) by G-tube route daily.   budesonide-formoterol 160-4.5 MCG/ACT inhaler Commonly known as:  Symbicort Inhale 2 puffs into the lungs 2 (two) times daily.   carvedilol 3.125 MG tablet Commonly known as:  COREG 1 tablet (3.125 mg total) by G-tube route Two (2) times a day.   feeding supplement (OSMOLITE 1.5 CAL) Liqd Place 1,000 mLs into feeding tube continuous for 30 days.   nutrition supplement (JUVEN) Pack Place 1 packet into feeding tube 2 (two) times daily between meals.   free water Soln  Place 50 mLs into feeding tube every 4 (four) hours.   glycopyrrolate 1 MG tablet Commonly known as:  ROBINUL Place 1 tablet (1 mg total) into feeding tube 2 (two) times daily.   heparin 5000 UNIT/ML injection Inject 1 mL into the skin every 8 (eight) hours.   loperamide 2 MG capsule Commonly known as:  IMODIUM Take 1 capsule by mouth 2 (two) times daily as needed.   OLANZapine 2.5 MG tablet Commonly known as:  ZYPREXA Place 1 tablet (2.5 mg total) into feeding tube at bedtime as needed (Insomnia). What changed:  Another medication with the same name was removed. Continue taking this medication, and follow the directions you see here.   ondansetron 4 MG/2ML Soln injection Commonly known as:  ZOFRAN Infuse 2 mL (4 mg total) into a venous catheter every eight (8) hours as needed.   oxyCODONE 5 MG/5ML solution Commonly known as:  ROXICODONE 5 mL (5 mg total) by G-tube route every four (4) hours as needed for up to 5 days.   polyethylene glycol packet Commonly known as:  MIRALAX / GLYCOLAX 17 g by G-tube route Two (2) times a day.  Psyllium 51.7 % Pack Commonly known as:  Metamucil Fiber Take 1 packet by mouth 2 (two) times daily for 30 days.   thiamine 100 MG tablet Place 1 tablet (100 mg total) into feeding tube daily.            Discharge Care Instructions  (From admission, onward)         Start     Ordered   08/29/18 0000  Discharge wound care:     08/29/18 1502           DISCHARGE INSTRUCTIONS:   DIET:  Patient on tube feeds.  Strict n.p.o. DISCHARGE CONDITION:  Stable ACTIVITY:  Activity as tolerated OXYGEN:  Home Oxygen: Yes.    Oxygen Delivery: Via trach collar DISCHARGE LOCATION:  home   If you experience worsening of your admission symptoms, develop shortness of breath, life threatening emergency, suicidal or homicidal thoughts you must seek medical attention immediately by calling 911 or calling your MD immediately  if symptoms less  severe.  You Must read complete instructions/literature along with all the possible adverse reactions/side effects for all the Medicines you take and that have been prescribed to you. Take any new Medicines after you have completely understood and accpet all the possible adverse reactions/side effects.   Please note  You were cared for by a hospitalist during your hospital stay. If you have any questions about your discharge medications or the care you received while you were in the hospital after you are discharged, you can call the unit and asked to speak with the hospitalist on call if the hospitalist that took care of you is not available. Once you are discharged, your primary care physician will handle any further medical issues. Please note that NO REFILLS for any discharge medications will be authorized once you are discharged, as it is imperative that you return to your primary care physician (or establish a relationship with a primary care physician if you do not have one) for your aftercare needs so that they can reassess your need for medications and monitor your lab values.    On the day of Discharge:  VITAL SIGNS:  Blood pressure 103/60, pulse 71, temperature 98.4 F (36.9 C), temperature source Oral, resp. rate 20, height  (1.727 m), weight 49.1 kg, SpO2 97 %. PHYSICAL EXAMINATION:  GENERAL:  53 y.o.-year-old patient lying in the bed with no acute distress.  EYES: Pupils equal, round, reactive to light and accommodation. No scleral icterus. Extraocular muscles intact.  HEENT: Head atraumatic, normocephalic. Oropharynx and nasopharynx clear.  NECK:  Supple, no jugular venous distention. No thyroid enlargement, no tenderness.  LUNGS: Normal breath sounds bilaterally, no wheezing, rales,rhonchi or crepitation. No use of accessory muscles of respiration.  CARDIOVASCULAR: S1, S2 normal. No murmurs, rubs, or gallops.  ABDOMEN: Soft, non-tender, non-distended. Bowel sounds present.  No organomegaly or mass.  EXTREMITIES: No pedal edema, cyanosis, or clubbing.  NEUROLOGIC: Cranial nerves II through XII are intact. Muscle strength 5/5 in all extremities. Sensation intact. Gait not checked.  PSYCHIATRIC: The patient is alert and oriented x 3.  SKIN: No obvious rash, lesion, or ulcer.  DATA REVIEW:   CBC Recent Labs  Lab 08/26/18 0350  WBC 10.4  HGB 10.9*  HCT 34.1*  PLT 318    Chemistries  Recent Labs  Lab 08/25/18 0123 08/26/18 0350  NA 133* 136  K 4.3 4.5  CL 93* 98  CO2 29 29  GLUCOSE 102* 111*  BUN 18 22*  CREATININE 0.53* 0.58*  CALCIUM 9.5 9.0  AST 37  --   ALT 50*  --   ALKPHOS 67  --   BILITOT 0.7  --      Microbiology Results  Results for orders placed or performed during the hospital encounter of 08/25/18  Blood Culture (routine x 2)     Status: None (Preliminary result)   Collection Time: 08/25/18  1:23 AM  Result Value Ref Range Status   Specimen Description BLOOD BLOOD RIGHT FOREARM  Final   Special Requests   Final    BOTTLES DRAWN AEROBIC AND ANAEROBIC Blood Culture results may not be optimal due to an excessive volume of blood received in culture bottles   Culture   Final    NO GROWTH 4 DAYS Performed at [facility], [address] [place], [place] [address]    Report Status PENDING  Incomplete  Blood Culture (routine x 2)     Status: None (Preliminary result)   Collection Time: 08/25/18  1:42 AM  Result Value Ref Range Status   Specimen Description BLOOD BLOOD LEFT FOREARM  Final   Special Requests   Final    BOTTLES DRAWN AEROBIC AND ANAEROBIC Blood Culture results may not be optimal due to an excessive volume of blood received in culture bottles   Culture   Final    NO GROWTH 4 DAYS Performed at [facility], [address] [place], [place] [address]    Report Status PENDING  Incomplete  Urine culture     Status: Abnormal   Collection Time: 08/25/18  8:00 PM  Result Value Ref Range Status    Specimen Description   Final    URINE, RANDOM Performed at [facility], [address] [place], [place] [address]    Special Requests   Final    NONE Performed at [facility], [address] [place], [place] [address]    Culture (A)  Final    <10,000 COLONIES/mL INSIGNIFICANT GROWTH Performed at [facility], [address] [place], [place] [address]    Report Status 08/27/2018 FINAL  Final    RADIOLOGY:  No results found.   Management plans discussed with the patient, family and they are in agreement.  CODE STATUS: Full Code   TOTAL TIME TAKING CARE OF THIS PATIENT: 40 minutes.    Tavi Hoogendoorn M.D on 08/29/2018 at 3:03 PM  Between 7am to 6pm - Pager - [phone]  After 6pm go to [email or web address] - Social research officer, [email or web address] [email or web address]  [employer] [facility]  Office  [phone]  CC: Primary care physician; Preston Fleetingevelo, Adrian Mancheno, MD   Note: This dictation was prepared with Dragon dictation along with smaller phrase technology. Any transcriptional errors that result from this process are unintentional.

## 2018-08-29 NOTE — Progress Notes (Signed)
Found patient using Vick Frees for suctioning the front end of his trach; instructed patient that he is never to use it for his trach; that is a sterile procedure, that if he needs suctioning, he is to use his call bell and nursing staff/Respiratory staff will do this for him; acknowledged; Respiratory also notified of breach of procedure for trach suctioning. Windy Carina, RN 6:20 AM 08/29/2018

## 2018-08-29 NOTE — Progress Notes (Signed)
Pt refused sx at this time

## 2018-08-29 NOTE — Progress Notes (Signed)
Patient concerned about his mouth not having the sensation of drinking; keeps asking for ice chips, drink, etc...  Strongly encouraged mouth swabs and rinsing without swallowing and disadvantage of drinking and apsiration; Mouthed understanding with discouragement. Windy Carina, RN 6:13 AM4/11/2018

## 2018-08-29 NOTE — Progress Notes (Signed)
Nutrition Follow Up Note  DOCUMENTATION CODES:   Severe malnutrition in context of acute illness/injury  INTERVENTION:   Osmolite 1.5 Cal at goal rate of 11m/hr.   Free water flushes 567mq4 hours to maintain tube patency   Provides 2160 kcal, 90 grams of protein, 1397 mL H2O daily.  Juven Fruit Punch BID per tube, each serving provides 95 kcal, 2.5 grams of protein, amino acids, and vitamins/minerals essential for wound healing.  Continue vitamin C 250 mg BID per tube.   NUTRITION DIAGNOSIS:   Severe Malnutrition related to acute illness as evidenced by 23 percent weight loss in <2 months, moderate to severe fat depletions, moderate to severe muscle depletions.  GOAL:   Patient will meet greater than or equal to 90% of their needs  -met with tube feeds  MONITOR:   Labs, Weight trends, TF tolerance, Skin, I & O's  ASSESSMENT:   [age]/o male w/ PMHx COPD, substance abuse and prior MRSA neck infection. [facility] w/ mediastinal extension/abscess + mediastinitis), [facility] and discharged 4/2. Pt returned 4/3 secondary to increased oral secretions and SOB   Pt tolerating tube feeds well at goat rate. Per chart, pt's weight is stable since admit. Recommend continue tube feeds at goal rate.   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  Estimated Nutritional Needs:   Kcal:  2000-2300kcal/day   Protein:  90-110g/day   Fluid:  >1.4L/day   CaKoleen DistanceS, RD, LDN Pager #- [phone]#- [phone] Hours Pager: [phone]

## 2018-08-29 NOTE — Progress Notes (Signed)
Pt's inner cannula was cleaned. I educated the pt on proper cleaning. I asked him to remove the inner cannula for me and he said he couldn't. I also reiterated to him to use the yaukauer only if he coughs secretions at the opening of the trach and not to place the yaukauer in the trach. He said he understood.

## 2018-08-30 LAB — CULTURE, BLOOD (ROUTINE X 2)
Culture: NO GROWTH
Culture: NO GROWTH

## 2018-09-01 ENCOUNTER — Telehealth: Payer: Self-pay | Admitting: Family Medicine

## 2018-09-01 NOTE — Telephone Encounter (Signed)
Patients sister called and left vm asking for a return phone call to discuss patients Oxycodone this patient has a chest wound that is currently being treated by home health.     Sister is Babette Relic [phone]

## 2018-09-04 NOTE — Telephone Encounter (Signed)
Contacted pt's sister who states that this matter was taken care of by Dr. Jacqulyn Bath. Pt's sister states she was trying to get in contact with Dr. Elodia Florence, the hospitalist who originally prescribed oxycodone. No other concerns voiced at this time.

## 2020-12-09 IMAGING — CT CT CHEST W/ CM
2 of 3 series · 15 of 36 positions shown, 18 images · IV contrast (omnipaque)
Comparison: CT scan of same day.  Radiograph of same day.

CLINICAL DATA: Chest pain, cough.

EXAM:
CT CHEST WITH CONTRAST
TECHNIQUE: Multidetector CT imaging of the chest was performed during
intravenous contrast administration.
CONTRAST:  60mL OMNIPAQUE IOHEXOL 300 MG/ML  SOLN

[Series 2: axial st · axial · 0.58mm/px · z∈[-601,-349]mm · 12 of 148 slices shown, 15 images]
[im 11/148  mediastinal]
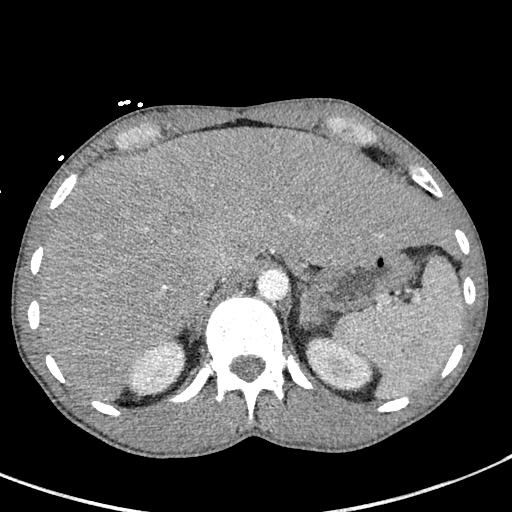
[im 11/148  lung]
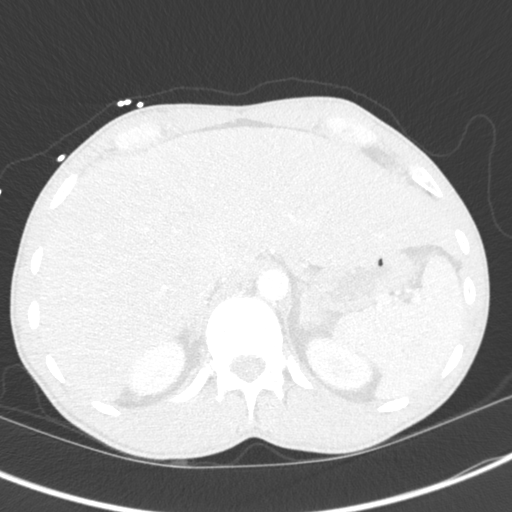
[im 22/148  lung]
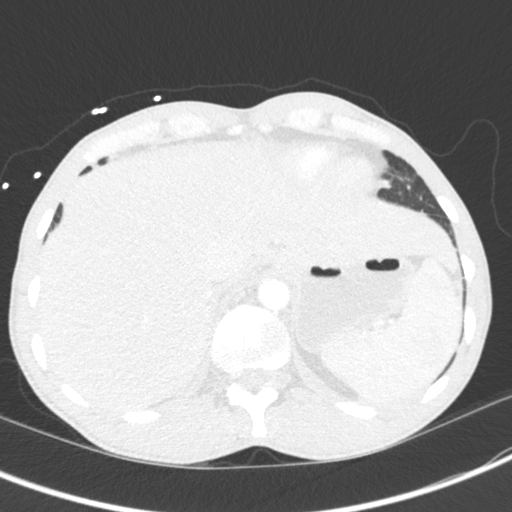
[im 33/148  lung]
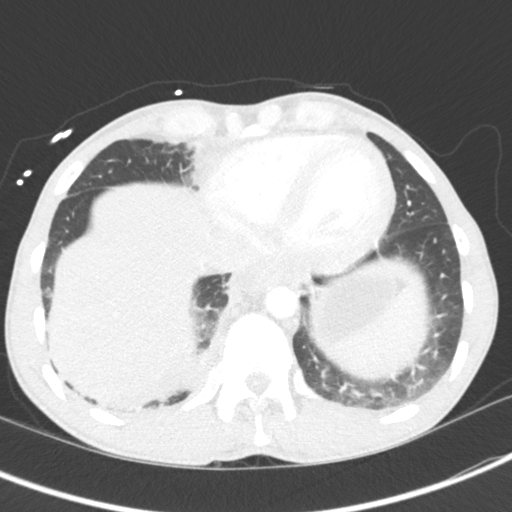
[im 44/148  lung]
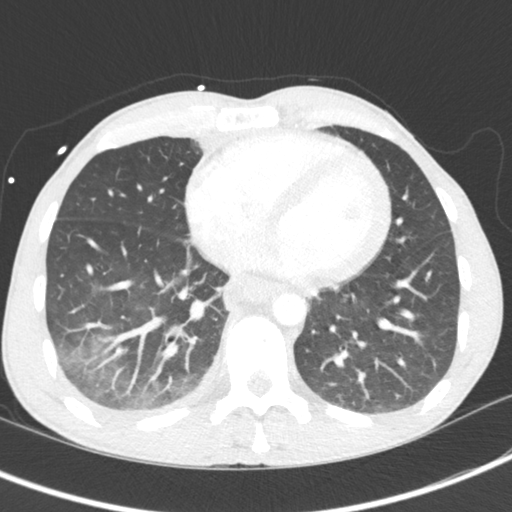
[im 55/148  mediastinal]
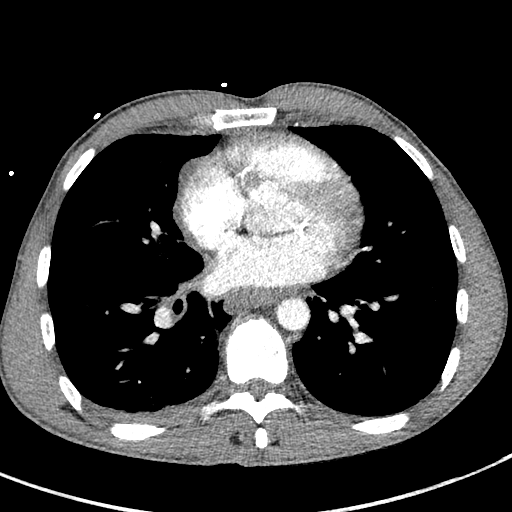
[im 55/148  lung]
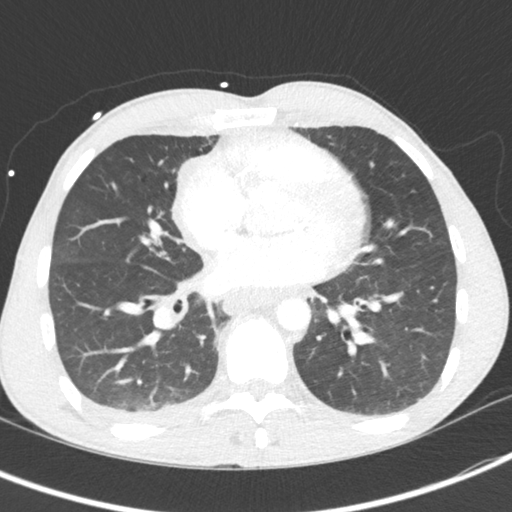
[im 66/148  lung]
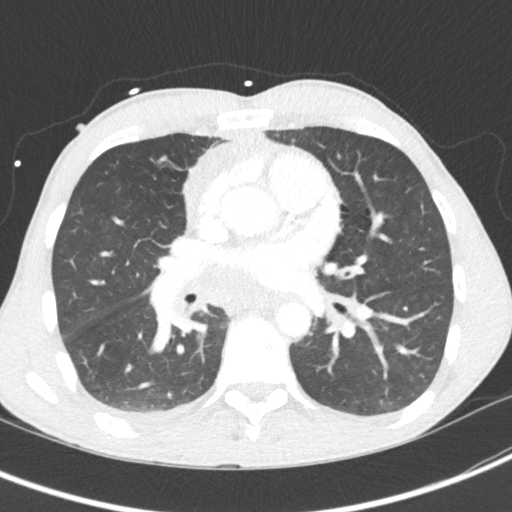
[im 82/148  lung]
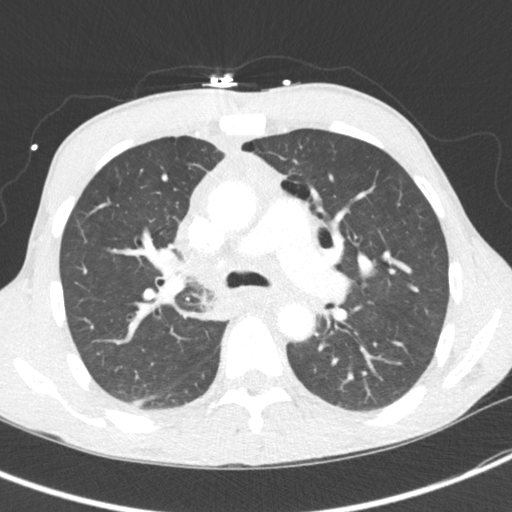
[im 93/148  lung]
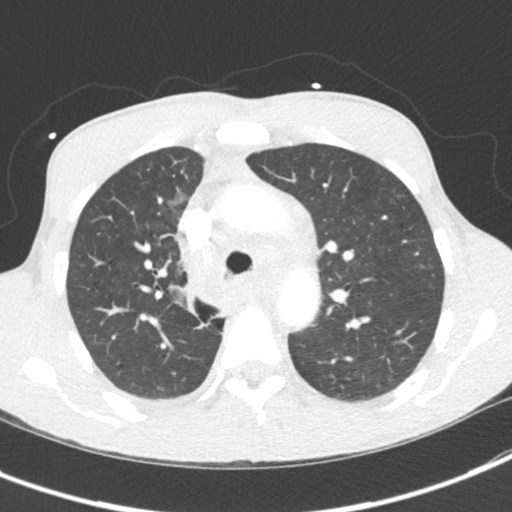
[im 104/148  mediastinal]
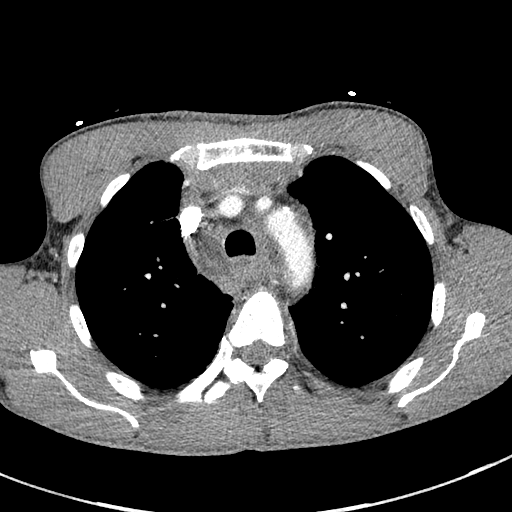
[im 104/148  lung]
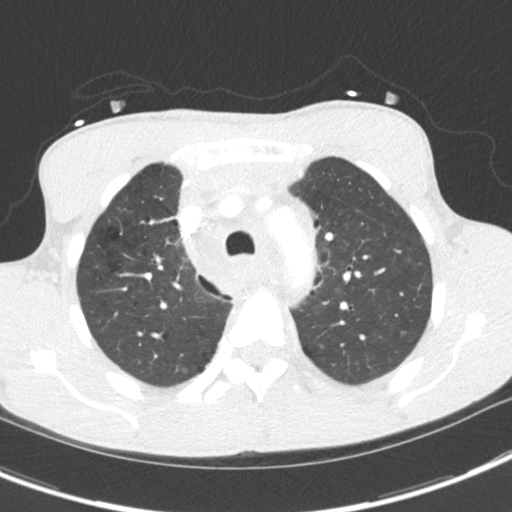
[im 115/148  lung]
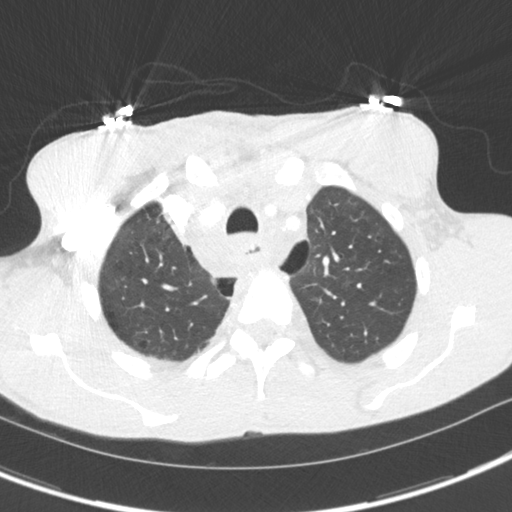
[im 126/148  lung]
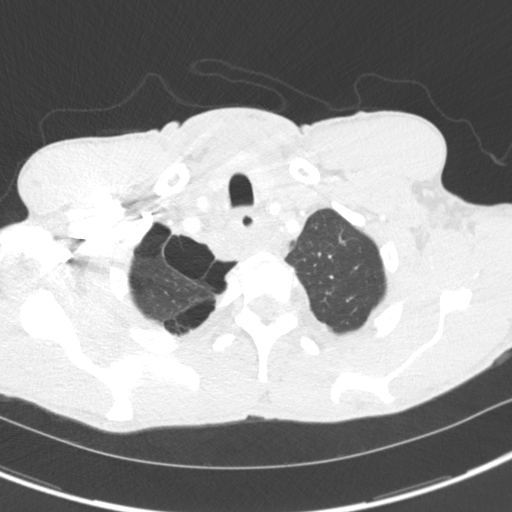
[im 137/148  lung]
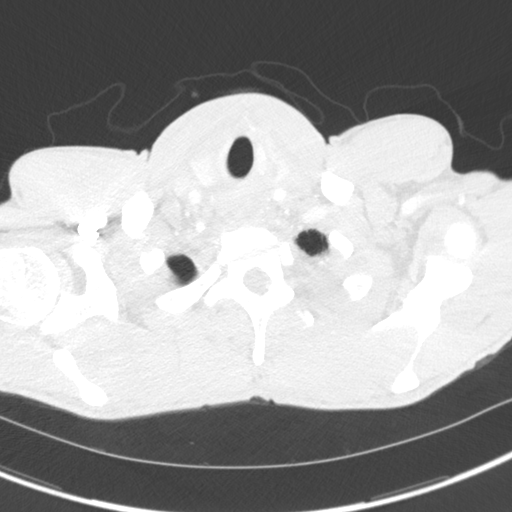

[Series 5: coronal · coronal · 0.58mm/px · 3 of 116 slices shown]
[im 24/116  lung]
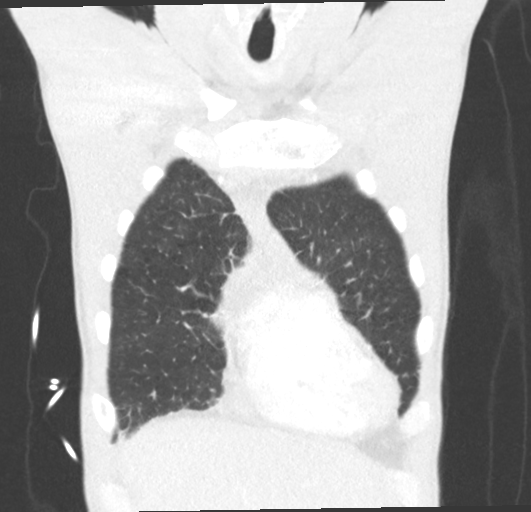
[im 47/116  lung]
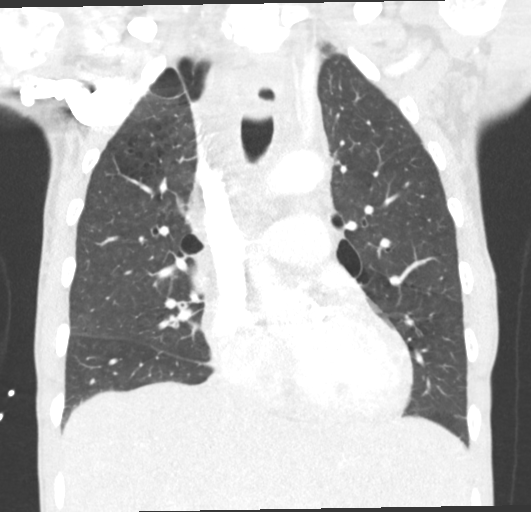
[im 70/116  lung]
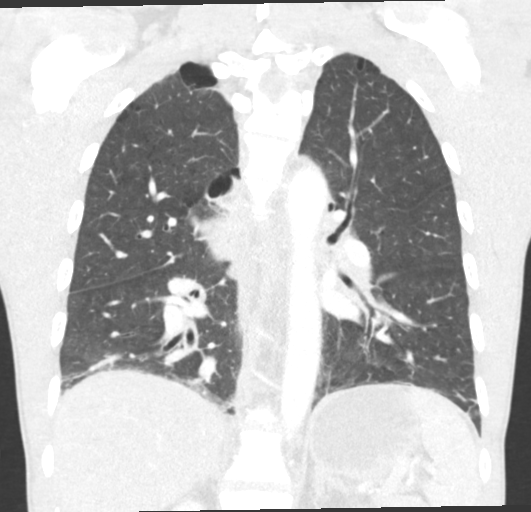

[15 of 36 positions shown; findings below may reference images not displayed]

FINDINGS: Cardiovascular: No significant vascular findings. Normal heart size.
No pericardial effusion.

Mediastinum/Nodes: Thyroid gland is unremarkable. As noted on prior
CT scan, ill-defined densities and inflammatory changes are seen in
the mediastinum. Subcarinal adenopathy measuring at least 2 cm is
noted. Probable fluid collection measuring 17 x 10 mm is noted in
right suprahilar region concerning for possible abscess or large
necrotic lymph node. No axillary adenopathy is noted. There also
appears to be diffuse wall thickening of the esophagus; is uncertain
if this represents primary esophagitis or secondary to surrounding
inflammation.

Lung: No pneumothorax or pleural effusion is noted. Bulla formation
is noted in both upper lobes, right greater than left. Mild
bilateral posterior basilar subsegmental atelectasis is noted.
Inflammatory changes are noted medially in right upper lobe in the
perihilar region in vicinity of right hilar fluid collection
described above. There appears to be a small accessory or tracheal
bronchus supplying this portion of the lung which originates well
above the carina; this most likely represents congenital anomaly.

Upper Abdomen: No acute abnormality.

Musculoskeletal: No chest wall abnormality. No acute or significant
osseous findings.
IMPRESSION: Irregular adenopathy and inflammatory changes are seen throughout
the mediastinum, most consistent with mediastinitis. 2 cm subcarinal
adenopathy is noted which most likely is inflammatory in origin.
Probable 17 x 10 mm fluid collection is noted in right suprahilar
region concerning for possible abscess or large necrotic lymph node.
There are inflammatory changes seen in the adjacent lung parenchyma
in the right hilar region which may represent pneumonia; this
portion of the right upper lobe is supplied by a small tracheal
bronchus which arises above the carina and is congenital anomaly.

Mild bilateral posterior basilar subsegmental atelectasis is noted.

Diffuse esophageal wall thickening is noted which may represent
primary esophagitis or be secondary to surrounding inflammation.
Endoscopy may be performed for further evaluation.

Bulla formation is noted in both lungs, right greater than left.

## 2021-10-30 ENCOUNTER — Ambulatory Visit
Admission: RE | Admit: 2021-10-30 | Discharge: 2021-10-30 | Disposition: A | Discharge status: Home / Self Care | Payer: Medicaid Other | Source: Ambulatory Visit | Attending: Family Medicine | Admitting: Family Medicine

## 2021-10-30 ENCOUNTER — Ambulatory Visit
Admission: RE | Admit: 2021-10-30 | Discharge: 2021-10-30 | Disposition: A | Discharge status: Home / Self Care | Payer: Medicaid Other | Attending: Family Medicine | Admitting: Family Medicine

## 2021-10-30 ENCOUNTER — Other Ambulatory Visit: Payer: Self-pay | Admitting: Family Medicine

## 2021-10-30 DIAGNOSIS — J449 Chronic obstructive pulmonary disease, unspecified: Secondary | ICD-10-CM | POA: Diagnosis present
